# Patient Record
Sex: Male | Born: 1955 | Race: White | Hispanic: No | Smoking: Never smoker
Health system: Southern US, Community
[De-identification: ages and names within clinical notes are randomized; demographics above are authoritative.]

## PROBLEM LIST (undated history)

## (undated) DIAGNOSIS — K5903 Drug induced constipation: Secondary | ICD-10-CM

## (undated) DIAGNOSIS — E039 Hypothyroidism, unspecified: Secondary | ICD-10-CM

## (undated) DIAGNOSIS — N400 Enlarged prostate without lower urinary tract symptoms: Secondary | ICD-10-CM

## (undated) DIAGNOSIS — M1611 Unilateral primary osteoarthritis, right hip: Secondary | ICD-10-CM

## (undated) DIAGNOSIS — I10 Essential (primary) hypertension: Secondary | ICD-10-CM

## (undated) DIAGNOSIS — M87052 Idiopathic aseptic necrosis of left femur: Secondary | ICD-10-CM

## (undated) HISTORY — PX: OTHER SURGICAL HISTORY: SHX169

## (undated) HISTORY — PX: COLONOSCOPY: SHX174

## (undated) HISTORY — PX: TONSILLECTOMY: SUR1361

---

## 2005-08-18 ENCOUNTER — Ambulatory Visit: Payer: Self-pay | Admitting: Internal Medicine

## 2005-08-25 ENCOUNTER — Ambulatory Visit: Payer: Self-pay | Admitting: Internal Medicine

## 2005-09-11 ENCOUNTER — Ambulatory Visit: Payer: Self-pay | Admitting: Internal Medicine

## 2005-09-29 ENCOUNTER — Ambulatory Visit: Payer: Self-pay | Admitting: Internal Medicine

## 2005-09-30 ENCOUNTER — Ambulatory Visit: Payer: Self-pay | Admitting: Internal Medicine

## 2006-07-29 ENCOUNTER — Ambulatory Visit: Payer: Self-pay | Admitting: Internal Medicine

## 2007-08-19 ENCOUNTER — Encounter: Admission: RE | Admit: 2007-08-19 | Discharge: 2007-08-19 | Payer: Self-pay | Admitting: Emergency Medicine

## 2014-09-06 ENCOUNTER — Other Ambulatory Visit: Payer: Self-pay | Admitting: Orthopedic Surgery

## 2014-09-08 ENCOUNTER — Encounter (HOSPITAL_COMMUNITY)
Admission: RE | Admit: 2014-09-08 | Discharge: 2014-09-08 | Disposition: A | Discharge status: Home / Self Care | Payer: 59 | Source: Ambulatory Visit | Attending: Orthopedic Surgery | Admitting: Orthopedic Surgery

## 2014-09-08 ENCOUNTER — Encounter (HOSPITAL_COMMUNITY): Payer: Self-pay

## 2014-09-08 DIAGNOSIS — Z01812 Encounter for preprocedural laboratory examination: Secondary | ICD-10-CM | POA: Diagnosis not present

## 2014-09-08 DIAGNOSIS — I10 Essential (primary) hypertension: Secondary | ICD-10-CM | POA: Insufficient documentation

## 2014-09-08 DIAGNOSIS — Z79899 Other long term (current) drug therapy: Secondary | ICD-10-CM | POA: Diagnosis not present

## 2014-09-08 DIAGNOSIS — M1990 Unspecified osteoarthritis, unspecified site: Secondary | ICD-10-CM | POA: Insufficient documentation

## 2014-09-08 DIAGNOSIS — E039 Hypothyroidism, unspecified: Secondary | ICD-10-CM | POA: Diagnosis not present

## 2014-09-08 DIAGNOSIS — Z7982 Long term (current) use of aspirin: Secondary | ICD-10-CM | POA: Diagnosis not present

## 2014-09-08 DIAGNOSIS — Z01818 Encounter for other preprocedural examination: Secondary | ICD-10-CM | POA: Insufficient documentation

## 2014-09-08 HISTORY — DX: Hypothyroidism, unspecified: E03.9

## 2014-09-08 HISTORY — DX: Essential (primary) hypertension: I10

## 2014-09-08 LAB — BASIC METABOLIC PANEL
ANION GAP: 13 (ref 5–15)
BUN: 28 mg/dL — ABNORMAL HIGH (ref 6–20)
CALCIUM: 10 mg/dL (ref 8.9–10.3)
CO2: 25 mmol/L (ref 22–32)
Chloride: 99 mmol/L — ABNORMAL LOW (ref 101–111)
Creatinine, Ser: 2.14 mg/dL — ABNORMAL HIGH (ref 0.61–1.24)
GFR, EST AFRICAN AMERICAN: 37 mL/min — AB (ref >60)
GFR, EST NON AFRICAN AMERICAN: 32 mL/min — AB (ref >60)
GLUCOSE: 93 mg/dL (ref 65–99)
Potassium: 4 mmol/L (ref 3.5–5.1)
SODIUM: 137 mmol/L (ref 135–145)

## 2014-09-08 LAB — CBC
HCT: 48.6 % (ref 39.0–52.0)
Hemoglobin: 16.7 g/dL (ref 13.0–17.0)
MCH: 30.4 pg (ref 26.0–34.0)
MCHC: 34.4 g/dL (ref 30.0–36.0)
MCV: 88.5 fL (ref 78.0–100.0)
PLATELETS: 363 10*3/uL (ref 150–400)
RBC: 5.49 MIL/uL (ref 4.22–5.81)
RDW: 13.1 % (ref 11.5–15.5)
WBC: 8.9 10*3/uL (ref 4.0–10.5)

## 2014-09-08 LAB — SURGICAL PCR SCREEN
MRSA, PCR: NEGATIVE
Staphylococcus aureus: POSITIVE — AB

## 2014-09-08 NOTE — Progress Notes (Signed)
EKG requested from [facility],[place]

## 2014-09-08 NOTE — Pre-Procedure Instructions (Signed)
    Phillip HeysJames Cook  09/08/2014     No Pharmacies Listed   Your procedure is scheduled on  09-12-2014   Tuesday    [facility] Ad[facility] at  8:45 A.M.   Call this number if you have problems the morning of surgery:  [phone]   Remember:  Do not eat food or drink liquids after midnight.   Take these medicines the morning of surgery with A SIP OF WATER Amlodipine(Norvasc),levothyroxine(Synthroid),Adderell,movantik,pain medication if needed,tizandine(Zanaflex)     Do not wear jewelry,  Do not wear lotions, powders, or perfumes.     Do not shave 48 hours prior to surgery.  Men may shave face and neck.   Do not bring valuables to the hospital.  [facility] is not responsible for any belongings or valuables.  Contacts, dentures or bridgework may not be worn into surgery.  Leave your suitcase in the car.  After surgery it may be brought to your room.  For patients admitted to the hospital, discharge time will be determined by your treatment team.  .    Special instructions:  See attached sheet for instructions on CHG shower/bath  Please read over the following fact sheets that you were given. Pain Booklet, Coughing and Deep Breathing and Surgical Site Infection Prevention

## 2014-09-08 NOTE — Progress Notes (Signed)
I called a prescription for Mupirocin ointment to [facility], [address], [place], [place].

## 2014-09-11 ENCOUNTER — Encounter (HOSPITAL_COMMUNITY): Payer: Self-pay | Admitting: Emergency Medicine

## 2014-09-11 MED ORDER — CEFAZOLIN SODIUM-DEXTROSE 2-3 GM-% IV SOLR: 2.0000 g | INTRAVENOUS | Status: DC

## 2014-09-11 NOTE — Progress Notes (Signed)
Anesthesia Chart Review:  Pt is 59 year old male scheduled for R total hip arthroplasty on 09/12/2014 with Dr. Dion SaucierLandau.   PCP is Dr. Glenis SmokerAlexis Benjamin at [facility] in [place].   PMH includes: HTN, hypothyroidism. Never smoker. BMI 24.  Medications include: amlodipine, adderall, ASA, levothyroxine, lisinipril-hctz, movantik, oxycodone, oxycontin, tizanidine, ambien.  Preoperative labs reviewed.  BUN 28, Cr 2.14.  Contacted PCP's office for recent renal lab results. On 08/28/14, BUN 19 and Cr 1.1.  EKG 08/25/2014: Sinus rhythm with PVCs. Possible LA abnormality. Anterolateral ST elevation - possible early repolarization.  -EKG tracing copy is poor quality but appears to have significant artifact. Will repeat DOS.   Spoke with pt by telephone to try and understand dramatic, acute change in renal function. Pt reports burning, frequency and urgency with urination for last couple of weeks, and reports feeling feverish/chills on some days.   Spoke with Dr. Krista BlueSinger about case. Pt will need to be evaluated by his pcp prior to surgery.  Will fax a copy of my note and lab results to Dr. Elly ModenaBenjamin's office. Notified Sherri in Dr. Shelba FlakeLandau's office.   Rica Mastngela Tameyah Koch, FNP-BC [facility]/Anesthesiology Phone: [phone] 09/11/2014 4:12 PM

## 2014-09-12 ENCOUNTER — Encounter (HOSPITAL_COMMUNITY): Admission: RE | Payer: Self-pay | Source: Ambulatory Visit

## 2014-09-12 ENCOUNTER — Inpatient Hospital Stay (HOSPITAL_COMMUNITY): Admission: RE | Admit: 2014-09-12 | Payer: 59 | Source: Ambulatory Visit | Admitting: Orthopedic Surgery

## 2014-09-12 SURGERY — ARTHROPLASTY, HIP, TOTAL,POSTERIOR APPROACH
Anesthesia: General | Site: Hip | Laterality: Right

## 2014-09-13 NOTE — Progress Notes (Signed)
Anesthesia Chart Review:  Pt is [age] year old male scheduled for R total hip arthroplasty on 09/19/2014 with Dr. Dion SaucierLandau.   Please see my note dated 09/11/2014.   Pt saw urologist Dr. Mena GoesEskridge 09/12/14 for evaluation of acute renal function changes. Cr normalized at 1.1, BUN 18. UA WNL. Dx with acute renal failure that has resolved, and BPH with obstructive symptoms. Dr. Mena GoesEskridge has cleared pt to proceed with surgery.   Rica Mastngela Matilyn Fehrman, FNP-BC [facility]Anesthesiology Phone: [phone] 09/13/2014 1:09 PM

## 2014-09-18 ENCOUNTER — Other Ambulatory Visit: Payer: Self-pay | Admitting: Orthopedic Surgery

## 2014-09-19 ENCOUNTER — Inpatient Hospital Stay (HOSPITAL_COMMUNITY)
Admission: RE | Admit: 2014-09-19 | Discharge: 2014-09-22 | DRG: 470 | Disposition: A | Discharge status: Skilled Nursing Facility | Payer: 59 | Source: Ambulatory Visit | Attending: Orthopedic Surgery | Admitting: Orthopedic Surgery

## 2014-09-19 ENCOUNTER — Inpatient Hospital Stay (HOSPITAL_COMMUNITY): Payer: 59 | Admitting: Emergency Medicine

## 2014-09-19 ENCOUNTER — Inpatient Hospital Stay (HOSPITAL_COMMUNITY): Payer: 59

## 2014-09-19 ENCOUNTER — Encounter (HOSPITAL_COMMUNITY)
Admission: RE | Disposition: A | Discharge status: Skilled Nursing Facility | Payer: Self-pay | Source: Ambulatory Visit | Attending: Orthopedic Surgery

## 2014-09-19 ENCOUNTER — Encounter (HOSPITAL_COMMUNITY): Payer: Self-pay | Admitting: *Deleted

## 2014-09-19 ENCOUNTER — Other Ambulatory Visit: Payer: Self-pay

## 2014-09-19 DIAGNOSIS — E039 Hypothyroidism, unspecified: Secondary | ICD-10-CM | POA: Diagnosis present

## 2014-09-19 DIAGNOSIS — I10 Essential (primary) hypertension: Secondary | ICD-10-CM | POA: Diagnosis present

## 2014-09-19 DIAGNOSIS — M87851 Other osteonecrosis, right femur: Secondary | ICD-10-CM | POA: Diagnosis present

## 2014-09-19 DIAGNOSIS — Z79899 Other long term (current) drug therapy: Secondary | ICD-10-CM

## 2014-09-19 DIAGNOSIS — F1010 Alcohol abuse, uncomplicated: Secondary | ICD-10-CM | POA: Diagnosis present

## 2014-09-19 DIAGNOSIS — M1611 Unilateral primary osteoarthritis, right hip: Principal | ICD-10-CM | POA: Diagnosis present

## 2014-09-19 DIAGNOSIS — M1610 Unilateral primary osteoarthritis, unspecified hip: Secondary | ICD-10-CM | POA: Diagnosis present

## 2014-09-19 DIAGNOSIS — N401 Enlarged prostate with lower urinary tract symptoms: Secondary | ICD-10-CM | POA: Diagnosis present

## 2014-09-19 DIAGNOSIS — K5909 Other constipation: Secondary | ICD-10-CM | POA: Diagnosis present

## 2014-09-19 DIAGNOSIS — F1011 Alcohol abuse, in remission: Secondary | ICD-10-CM | POA: Insufficient documentation

## 2014-09-19 DIAGNOSIS — Z515 Encounter for palliative care: Secondary | ICD-10-CM | POA: Diagnosis not present

## 2014-09-19 DIAGNOSIS — G894 Chronic pain syndrome: Secondary | ICD-10-CM | POA: Diagnosis present

## 2014-09-19 DIAGNOSIS — T402X5A Adverse effect of other opioids, initial encounter: Secondary | ICD-10-CM | POA: Diagnosis present

## 2014-09-19 DIAGNOSIS — G8918 Other acute postprocedural pain: Secondary | ICD-10-CM | POA: Diagnosis not present

## 2014-09-19 DIAGNOSIS — K5903 Drug induced constipation: Secondary | ICD-10-CM | POA: Insufficient documentation

## 2014-09-19 DIAGNOSIS — N138 Other obstructive and reflux uropathy: Secondary | ICD-10-CM | POA: Diagnosis present

## 2014-09-19 DIAGNOSIS — M25551 Pain in right hip: Secondary | ICD-10-CM | POA: Diagnosis present

## 2014-09-19 HISTORY — DX: Unilateral primary osteoarthritis, right hip: M16.11

## 2014-09-19 HISTORY — PX: TOTAL HIP ARTHROPLASTY: SHX124

## 2014-09-19 LAB — BASIC METABOLIC PANEL
Anion gap: 7 (ref 5–15)
BUN: 12 mg/dL (ref 6–20)
CO2: 28 mmol/L (ref 22–32)
CREATININE: 1.17 mg/dL (ref 0.61–1.24)
Calcium: 9.3 mg/dL (ref 8.9–10.3)
Chloride: 103 mmol/L (ref 101–111)
GFR calc non Af Amer: >60 mL/min (ref >60)
Glucose, Bld: 110 mg/dL — ABNORMAL HIGH (ref 65–99)
Potassium: 3.6 mmol/L (ref 3.5–5.1)
Sodium: 138 mmol/L (ref 135–145)

## 2014-09-19 LAB — CBC
HEMATOCRIT: 43.1 % (ref 39.0–52.0)
Hemoglobin: 14.6 g/dL (ref 13.0–17.0)
MCH: 29.5 pg (ref 26.0–34.0)
MCHC: 33.9 g/dL (ref 30.0–36.0)
MCV: 87.1 fL (ref 78.0–100.0)
Platelets: 261 10*3/uL (ref 150–400)
RBC: 4.95 MIL/uL (ref 4.22–5.81)
RDW: 13.1 % (ref 11.5–15.5)
WBC: 8.2 10*3/uL (ref 4.0–10.5)

## 2014-09-19 SURGERY — ARTHROPLASTY, HIP, TOTAL,POSTERIOR APPROACH
Anesthesia: Spinal | Site: Hip | Laterality: Right

## 2014-09-19 MED ORDER — DEXAMETHASONE SODIUM PHOSPHATE 10 MG/ML IJ SOLN
10.0000 mg | Freq: Once | INTRAMUSCULAR | Status: AC
  Administered 2014-09-20: 10 mg via INTRAVENOUS
  Filled 2014-09-19: qty 1

## 2014-09-19 MED ORDER — EPHEDRINE SULFATE 50 MG/ML IJ SOLN
INTRAMUSCULAR | Status: DC | PRN
  Administered 2014-09-19 (×2): 10 mg via INTRAVENOUS

## 2014-09-19 MED ORDER — LACTATED RINGERS IV SOLN
INTRAVENOUS | Status: DC | PRN
  Administered 2014-09-19 (×2): via INTRAVENOUS

## 2014-09-19 MED ORDER — TIZANIDINE HCL 4 MG PO TABS
4.0000 mg | ORAL_TABLET | Freq: Three times a day (TID) | ORAL | Status: DC | PRN
  Administered 2014-09-19 – 2014-09-20 (×3): 4 mg via ORAL
  Filled 2014-09-19 (×3): qty 1

## 2014-09-19 MED ORDER — METHOCARBAMOL 500 MG PO TABS
ORAL_TABLET | ORAL | Status: AC
  Filled 2014-09-19: qty 1

## 2014-09-19 MED ORDER — 0.9 % SODIUM CHLORIDE (POUR BTL) OPTIME
TOPICAL | Status: DC | PRN
  Administered 2014-09-19: 1000 mL

## 2014-09-19 MED ORDER — ZOLPIDEM TARTRATE 5 MG PO TABS
10.0000 mg | ORAL_TABLET | Freq: Every day | ORAL | Status: DC
  Administered 2014-09-19 – 2014-09-21 (×3): 10 mg via ORAL
  Filled 2014-09-19 (×3): qty 2

## 2014-09-19 MED ORDER — HYDROMORPHONE HCL 1 MG/ML IJ SOLN
2.0000 mg | Freq: Once | INTRAMUSCULAR | Status: AC
  Administered 2014-09-19: 2 mg via INTRAVENOUS
  Filled 2014-09-19: qty 2

## 2014-09-19 MED ORDER — ONDANSETRON HCL 4 MG/2ML IJ SOLN: 4.0000 mg | Freq: Four times a day (QID) | INTRAMUSCULAR | Status: DC | PRN

## 2014-09-19 MED ORDER — HYDROMORPHONE HCL 1 MG/ML IJ SOLN
INTRAMUSCULAR | Status: AC
  Filled 2014-09-19: qty 1

## 2014-09-19 MED ORDER — NALOXEGOL OXALATE 25 MG PO TABS
25.0000 mg | ORAL_TABLET | Freq: Every day | ORAL | Status: DC
  Administered 2014-09-22: 25 mg via ORAL
  Filled 2014-09-19 (×3): qty 1

## 2014-09-19 MED ORDER — LIDOCAINE HCL (CARDIAC) 20 MG/ML IV SOLN
INTRAVENOUS | Status: DC | PRN
  Administered 2014-09-19: 60 mg via INTRAVENOUS

## 2014-09-19 MED ORDER — AMLODIPINE BESYLATE 10 MG PO TABS
10.0000 mg | ORAL_TABLET | Freq: Every day | ORAL | Status: DC
  Administered 2014-09-20 – 2014-09-22 (×3): 10 mg via ORAL
  Filled 2014-09-19 (×3): qty 1

## 2014-09-19 MED ORDER — LIDOCAINE HCL (CARDIAC) 20 MG/ML IV SOLN
INTRAVENOUS | Status: AC
  Filled 2014-09-19: qty 5

## 2014-09-19 MED ORDER — PHENYLEPHRINE 40 MCG/ML (10ML) SYRINGE FOR IV PUSH (FOR BLOOD PRESSURE SUPPORT)
PREFILLED_SYRINGE | INTRAVENOUS | Status: AC
  Filled 2014-09-19: qty 10

## 2014-09-19 MED ORDER — TIZANIDINE HCL 4 MG PO TABS: 4.0000 mg | ORAL_TABLET | Freq: Three times a day (TID) | ORAL | Status: DC | PRN

## 2014-09-19 MED ORDER — HYDROMORPHONE HCL 2 MG PO TABS: 2.0000 mg | ORAL_TABLET | ORAL | Status: DC | PRN

## 2014-09-19 MED ORDER — HYDROMORPHONE HCL 1 MG/ML IJ SOLN
1.5000 mg | INTRAMUSCULAR | Status: DC | PRN
  Administered 2014-09-19: 1.5 mg via INTRAVENOUS
  Filled 2014-09-19: qty 2

## 2014-09-19 MED ORDER — FENTANYL CITRATE (PF) 100 MCG/2ML IJ SOLN
INTRAMUSCULAR | Status: DC | PRN
  Administered 2014-09-19 (×3): 50 ug via INTRAVENOUS

## 2014-09-19 MED ORDER — METHOCARBAMOL 500 MG PO TABS
500.0000 mg | ORAL_TABLET | Freq: Four times a day (QID) | ORAL | Status: DC | PRN
  Administered 2014-09-19: 500 mg via ORAL
  Filled 2014-09-19: qty 1

## 2014-09-19 MED ORDER — LISINOPRIL 20 MG PO TABS
20.0000 mg | ORAL_TABLET | Freq: Every day | ORAL | Status: DC
  Administered 2014-09-20 – 2014-09-22 (×3): 20 mg via ORAL
  Filled 2014-09-19 (×3): qty 1

## 2014-09-19 MED ORDER — METHOCARBAMOL 1000 MG/10ML IJ SOLN
500.0000 mg | Freq: Four times a day (QID) | INTRAVENOUS | Status: DC | PRN
  Filled 2014-09-19: qty 5

## 2014-09-19 MED ORDER — PROPOFOL 10 MG/ML IV BOLUS
INTRAVENOUS | Status: DC | PRN
  Administered 2014-09-19: 75 mg via INTRAVENOUS
  Administered 2014-09-19: 25 mg via INTRAVENOUS

## 2014-09-19 MED ORDER — ONDANSETRON HCL 4 MG PO TABS: 4.0000 mg | ORAL_TABLET | Freq: Four times a day (QID) | ORAL | Status: DC | PRN

## 2014-09-19 MED ORDER — DOCUSATE SODIUM 100 MG PO CAPS
100.0000 mg | ORAL_CAPSULE | Freq: Two times a day (BID) | ORAL | Status: DC
  Administered 2014-09-19: 100 mg via ORAL
  Filled 2014-09-19: qty 1

## 2014-09-19 MED ORDER — ALUM & MAG HYDROXIDE-SIMETH 200-200-20 MG/5ML PO SUSP: 30.0000 mL | ORAL | Status: DC | PRN

## 2014-09-19 MED ORDER — PHENOL 1.4 % MT LIQD: 1.0000 | OROMUCOSAL | Status: DC | PRN

## 2014-09-19 MED ORDER — RIVAROXABAN 10 MG PO TABS
10.0000 mg | ORAL_TABLET | Freq: Every day | ORAL | Status: DC
  Administered 2014-09-20 – 2014-09-22 (×3): 10 mg via ORAL
  Filled 2014-09-19 (×3): qty 1

## 2014-09-19 MED ORDER — TESTOSTERONE 20.25 MG/ACT (1.62%) TD GEL
20.2500 mg | Freq: Every day | TRANSDERMAL | Status: DC
  Filled 2014-09-19: qty 1

## 2014-09-19 MED ORDER — SUCCINYLCHOLINE CHLORIDE 20 MG/ML IJ SOLN
INTRAMUSCULAR | Status: AC
  Filled 2014-09-19: qty 1

## 2014-09-19 MED ORDER — ACETAMINOPHEN 325 MG PO TABS: 650.0000 mg | ORAL_TABLET | Freq: Four times a day (QID) | ORAL | Status: DC | PRN

## 2014-09-19 MED ORDER — SENNA-DOCUSATE SODIUM 8.6-50 MG PO TABS: 2.0000 | ORAL_TABLET | Freq: Every day | ORAL | Status: DC

## 2014-09-19 MED ORDER — MAGNESIUM CITRATE PO SOLN: 1.0000 | Freq: Once | ORAL | Status: AC | PRN

## 2014-09-19 MED ORDER — ACETAMINOPHEN 650 MG RE SUPP: 650.0000 mg | Freq: Four times a day (QID) | RECTAL | Status: DC | PRN

## 2014-09-19 MED ORDER — CEFAZOLIN SODIUM-DEXTROSE 2-3 GM-% IV SOLR
2.0000 g | Freq: Four times a day (QID) | INTRAVENOUS | Status: AC
  Administered 2014-09-19 (×2): 2 g via INTRAVENOUS
  Filled 2014-09-19 (×3): qty 50

## 2014-09-19 MED ORDER — MEPERIDINE HCL 25 MG/ML IJ SOLN: 6.2500 mg | INTRAMUSCULAR | Status: DC | PRN

## 2014-09-19 MED ORDER — MENTHOL 3 MG MT LOZG: 1.0000 | LOZENGE | OROMUCOSAL | Status: DC | PRN

## 2014-09-19 MED ORDER — MIDAZOLAM HCL 2 MG/2ML IJ SOLN
INTRAMUSCULAR | Status: AC
  Filled 2014-09-19: qty 2

## 2014-09-19 MED ORDER — BACLOFEN 10 MG PO TABS: 10.0000 mg | ORAL_TABLET | Freq: Three times a day (TID) | ORAL | Status: DC

## 2014-09-19 MED ORDER — SENNA 8.6 MG PO TABS
1.0000 | ORAL_TABLET | Freq: Two times a day (BID) | ORAL | Status: DC
  Administered 2014-09-19: 8.6 mg via ORAL
  Filled 2014-09-19: qty 1

## 2014-09-19 MED ORDER — CEFAZOLIN SODIUM-DEXTROSE 2-3 GM-% IV SOLR
2.0000 g | INTRAVENOUS | Status: AC
  Administered 2014-09-19: 2 g via INTRAVENOUS
  Filled 2014-09-19: qty 50

## 2014-09-19 MED ORDER — ONDANSETRON HCL 4 MG PO TABS: 4.0000 mg | ORAL_TABLET | Freq: Three times a day (TID) | ORAL | Status: DC | PRN

## 2014-09-19 MED ORDER — PROMETHAZINE HCL 25 MG/ML IJ SOLN: 6.2500 mg | INTRAMUSCULAR | Status: DC | PRN

## 2014-09-19 MED ORDER — NALOXONE HCL 0.4 MG/ML IJ SOLN: 0.4000 mg | INTRAMUSCULAR | Status: DC | PRN

## 2014-09-19 MED ORDER — HYDROMORPHONE HCL 1 MG/ML IJ SOLN
1.0000 mg | INTRAMUSCULAR | Status: DC | PRN
  Administered 2014-09-19: 2 mg via INTRAVENOUS
  Administered 2014-09-19 (×2): 1 mg via INTRAVENOUS
  Filled 2014-09-19 (×2): qty 1

## 2014-09-19 MED ORDER — DIPHENHYDRAMINE HCL 12.5 MG/5ML PO ELIX
12.5000 mg | ORAL_SOLUTION | ORAL | Status: DC | PRN
Start: 2014-09-19 — End: 2014-09-22

## 2014-09-19 MED ORDER — ONDANSETRON HCL 4 MG/2ML IJ SOLN
INTRAMUSCULAR | Status: AC
  Filled 2014-09-19: qty 2

## 2014-09-19 MED ORDER — OXYCODONE HCL 5 MG PO TABS
15.0000 mg | ORAL_TABLET | Freq: Every day | ORAL | Status: DC
  Administered 2014-09-19 (×2): 15 mg via ORAL
  Filled 2014-09-19 (×2): qty 3

## 2014-09-19 MED ORDER — PROPOFOL INFUSION 10 MG/ML OPTIME
INTRAVENOUS | Status: DC | PRN
  Administered 2014-09-19: 80 ug/kg/min via INTRAVENOUS

## 2014-09-19 MED ORDER — DIAZEPAM 5 MG PO TABS: 5.0000 mg | ORAL_TABLET | Freq: Four times a day (QID) | ORAL | Status: DC | PRN

## 2014-09-19 MED ORDER — HYDROCHLOROTHIAZIDE 12.5 MG PO CAPS
12.5000 mg | ORAL_CAPSULE | Freq: Every day | ORAL | Status: DC
  Administered 2014-09-20 – 2014-09-22 (×3): 12.5 mg via ORAL
  Filled 2014-09-19 (×3): qty 1

## 2014-09-19 MED ORDER — MIDAZOLAM HCL 5 MG/5ML IJ SOLN
INTRAMUSCULAR | Status: DC | PRN
  Administered 2014-09-19: 2 mg via INTRAVENOUS

## 2014-09-19 MED ORDER — FENTANYL CITRATE (PF) 250 MCG/5ML IJ SOLN
INTRAMUSCULAR | Status: AC
  Filled 2014-09-19: qty 5

## 2014-09-19 MED ORDER — BISACODYL 10 MG RE SUPP: 10.0000 mg | Freq: Every day | RECTAL | Status: DC | PRN

## 2014-09-19 MED ORDER — ZOLPIDEM TARTRATE 5 MG PO TABS: 5.0000 mg | ORAL_TABLET | Freq: Every evening | ORAL | Status: DC | PRN

## 2014-09-19 MED ORDER — LISINOPRIL-HYDROCHLOROTHIAZIDE 20-12.5 MG PO TABS: 1.0000 | ORAL_TABLET | Freq: Every day | ORAL | Status: DC

## 2014-09-19 MED ORDER — EPHEDRINE SULFATE 50 MG/ML IJ SOLN
INTRAMUSCULAR | Status: AC
  Filled 2014-09-19: qty 1

## 2014-09-19 MED ORDER — SENNOSIDES-DOCUSATE SODIUM 8.6-50 MG PO TABS
2.0000 | ORAL_TABLET | Freq: Two times a day (BID) | ORAL | Status: DC
  Administered 2014-09-19 – 2014-09-22 (×6): 2 via ORAL
  Filled 2014-09-19 (×6): qty 2

## 2014-09-19 MED ORDER — METOCLOPRAMIDE HCL 5 MG/ML IJ SOLN
5.0000 mg | Freq: Three times a day (TID) | INTRAMUSCULAR | Status: DC | PRN
Start: 2014-09-19 — End: 2014-09-22

## 2014-09-19 MED ORDER — OXYCODONE HCL ER 15 MG PO T12A
30.0000 mg | EXTENDED_RELEASE_TABLET | Freq: Two times a day (BID) | ORAL | Status: DC
  Administered 2014-09-19: 30 mg via ORAL
  Filled 2014-09-19: qty 2

## 2014-09-19 MED ORDER — OXYCODONE HCL 15 MG PO TABS: 15.0000 mg | ORAL_TABLET | Freq: Every day | ORAL | Status: DC

## 2014-09-19 MED ORDER — OXYCODONE HCL 5 MG PO TABS
5.0000 mg | ORAL_TABLET | ORAL | Status: DC | PRN
  Administered 2014-09-19: 5 mg via ORAL

## 2014-09-19 MED ORDER — PROPOFOL 10 MG/ML IV BOLUS
INTRAVENOUS | Status: AC
  Filled 2014-09-19: qty 20

## 2014-09-19 MED ORDER — HYDROMORPHONE HCL 1 MG/ML IJ SOLN: 1.5000 mg | INTRAMUSCULAR | Status: DC | PRN

## 2014-09-19 MED ORDER — SODIUM CHLORIDE 0.9 % IJ SOLN
INTRAMUSCULAR | Status: AC
  Filled 2014-09-19: qty 10

## 2014-09-19 MED ORDER — OXYCONTIN 30 MG PO T12A: 30.0000 mg | EXTENDED_RELEASE_TABLET | Freq: Two times a day (BID) | ORAL | Status: DC

## 2014-09-19 MED ORDER — DIAZEPAM 5 MG PO TABS
5.0000 mg | ORAL_TABLET | Freq: Four times a day (QID) | ORAL | Status: DC | PRN
  Administered 2014-09-19 – 2014-09-20 (×3): 5 mg via ORAL
  Filled 2014-09-19 (×3): qty 1

## 2014-09-19 MED ORDER — POTASSIUM CHLORIDE IN NACL 20-0.45 MEQ/L-% IV SOLN
INTRAVENOUS | Status: DC
  Administered 2014-09-19 – 2014-09-20 (×2): via INTRAVENOUS
  Filled 2014-09-19 (×8): qty 1000

## 2014-09-19 MED ORDER — LEVOTHYROXINE SODIUM 150 MCG PO TABS
150.0000 ug | ORAL_TABLET | Freq: Two times a day (BID) | ORAL | Status: DC
  Administered 2014-09-19 – 2014-09-22 (×6): 150 ug via ORAL
  Filled 2014-09-19 (×7): qty 1

## 2014-09-19 MED ORDER — PHENYLEPHRINE HCL 10 MG/ML IJ SOLN
10.0000 mg | INTRAVENOUS | Status: DC | PRN
  Administered 2014-09-19: 10 ug/min via INTRAVENOUS

## 2014-09-19 MED ORDER — AMPHETAMINE-DEXTROAMPHETAMINE 10 MG PO TABS
20.0000 mg | ORAL_TABLET | Freq: Every day | ORAL | Status: DC
  Administered 2014-09-20 – 2014-09-22 (×3): 20 mg via ORAL
  Filled 2014-09-19 (×3): qty 2

## 2014-09-19 MED ORDER — ROCURONIUM BROMIDE 50 MG/5ML IV SOLN
INTRAVENOUS | Status: AC
  Filled 2014-09-19: qty 1

## 2014-09-19 MED ORDER — HYDROMORPHONE HCL 1 MG/ML IJ SOLN
0.2500 mg | INTRAMUSCULAR | Status: DC | PRN
  Administered 2014-09-19 (×4): 0.5 mg via INTRAVENOUS

## 2014-09-19 MED ORDER — POLYETHYLENE GLYCOL 3350 17 G PO PACK: 17.0000 g | PACK | Freq: Every day | ORAL | Status: DC | PRN

## 2014-09-19 MED ORDER — OXYCODONE HCL ER 30 MG PO T12A: 30.0000 mg | EXTENDED_RELEASE_TABLET | Freq: Two times a day (BID) | ORAL | Status: DC

## 2014-09-19 MED ORDER — OXYCODONE HCL 5 MG PO TABS
ORAL_TABLET | ORAL | Status: AC
  Filled 2014-09-19: qty 1

## 2014-09-19 MED ORDER — HYDROMORPHONE 0.3 MG/ML IV SOLN
INTRAVENOUS | Status: DC
  Administered 2014-09-19 – 2014-09-20 (×2): via INTRAVENOUS
  Administered 2014-09-20: 2.25 mg via INTRAVENOUS
  Administered 2014-09-20: 7.54 mg via INTRAVENOUS
  Administered 2014-09-20 (×2): via INTRAVENOUS
  Administered 2014-09-20: 10.4 mg via INTRAVENOUS
  Administered 2014-09-20: 4.55 mg via INTRAVENOUS
  Administered 2014-09-20: 4.88 mg via INTRAVENOUS
  Administered 2014-09-21: 5.08 mg via INTRAVENOUS
  Administered 2014-09-21: 03:00:00 via INTRAVENOUS
  Administered 2014-09-21: 6.6 mg via INTRAVENOUS
  Administered 2014-09-21: 09:00:00 via INTRAVENOUS
  Administered 2014-09-21: 2.15 mg via INTRAVENOUS
  Filled 2014-09-19 (×7): qty 25

## 2014-09-19 MED ORDER — SODIUM CHLORIDE 0.9 % IJ SOLN: 9.0000 mL | INTRAMUSCULAR | Status: DC | PRN

## 2014-09-19 MED ORDER — METOCLOPRAMIDE HCL 5 MG PO TABS: 5.0000 mg | ORAL_TABLET | Freq: Three times a day (TID) | ORAL | Status: DC | PRN

## 2014-09-19 MED ORDER — RIVAROXABAN 10 MG PO TABS: 10.0000 mg | ORAL_TABLET | Freq: Every day | ORAL | Status: DC

## 2014-09-19 SURGICAL SUPPLY — 64 items
BLADE SAW SAG 73X25 THK (BLADE) ×2
BLADE SAW SGTL 73X25 THK (BLADE) ×1 IMPLANT
BRUSH FEMORAL CANAL (MISCELLANEOUS) IMPLANT
CAPT HIP TOTAL 2 ×3 IMPLANT
CLOSURE STERI-STRIP 1/2X4 (GAUZE/BANDAGES/DRESSINGS) ×2
CLSR STERI-STRIP ANTIMIC 1/2X4 (GAUZE/BANDAGES/DRESSINGS) ×4 IMPLANT
COVER SURGICAL LIGHT HANDLE (MISCELLANEOUS) ×3 IMPLANT
DRAPE IMP U-DRAPE 54X76 (DRAPES) ×3 IMPLANT
DRAPE INCISE IOBAN 66X45 STRL (DRAPES) IMPLANT
DRAPE ORTHO SPLIT 77X108 STRL (DRAPES) ×4
DRAPE PROXIMA HALF (DRAPES) ×6 IMPLANT
DRAPE SURG ORHT 6 SPLT 77X108 (DRAPES) ×2 IMPLANT
DRAPE U-SHAPE 47X51 STRL (DRAPES) ×3 IMPLANT
DRILL BIT 5/64 (BIT) ×3 IMPLANT
DRSG MEPILEX BORDER 4X12 (GAUZE/BANDAGES/DRESSINGS) IMPLANT
DRSG MEPILEX BORDER 4X8 (GAUZE/BANDAGES/DRESSINGS) ×3 IMPLANT
DRSG PAD ABDOMINAL 8X10 ST (GAUZE/BANDAGES/DRESSINGS) IMPLANT
DURAPREP 26ML APPLICATOR (WOUND CARE) ×3 IMPLANT
ELECT CAUTERY BLADE 6.4 (BLADE) ×3 IMPLANT
ELECT REM PT RETURN 9FT ADLT (ELECTROSURGICAL) ×3
ELECTRODE REM PT RTRN 9FT ADLT (ELECTROSURGICAL) ×1 IMPLANT
FACESHIELD STD STERILE (MASK) ×3 IMPLANT
GLOVE BIOGEL PI IND STRL 8 (GLOVE) ×1 IMPLANT
GLOVE BIOGEL PI INDICATOR 8 (GLOVE) ×2
GLOVE BIOGEL PI ORTHO PRO SZ8 (GLOVE) ×2
GLOVE ORTHO TXT STRL SZ7.5 (GLOVE) ×3 IMPLANT
GLOVE PI ORTHO PRO STRL SZ8 (GLOVE) ×1 IMPLANT
GLOVE SURG ORTHO 8.0 STRL STRW (GLOVE) ×3 IMPLANT
GOWN STRL REUS W/ TWL LRG LVL3 (GOWN DISPOSABLE) ×2 IMPLANT
GOWN STRL REUS W/ TWL XL LVL3 (GOWN DISPOSABLE) ×1 IMPLANT
GOWN STRL REUS W/TWL 2XL LVL3 (GOWN DISPOSABLE) ×3 IMPLANT
GOWN STRL REUS W/TWL LRG LVL3 (GOWN DISPOSABLE) ×4
GOWN STRL REUS W/TWL XL LVL3 (GOWN DISPOSABLE) ×2
HANDPIECE INTERPULSE COAX TIP (DISPOSABLE)
HOOD PEEL AWAY FACE SHEILD DIS (HOOD) ×3 IMPLANT
KIT BASIN OR (CUSTOM PROCEDURE TRAY) ×3 IMPLANT
KIT ROOM TURNOVER OR (KITS) ×3 IMPLANT
MANIFOLD NEPTUNE II (INSTRUMENTS) ×3 IMPLANT
NDL SUT .5 MAYO 1.404X.05X (NEEDLE) ×1 IMPLANT
NEEDLE HYPO 25GX1X1/2 BEV (NEEDLE) ×3 IMPLANT
NEEDLE MAYO TAPER (NEEDLE) ×2
NS IRRIG 1000ML POUR BTL (IV SOLUTION) ×3 IMPLANT
PACK TOTAL JOINT (CUSTOM PROCEDURE TRAY) ×3 IMPLANT
PAD ARMBOARD 7.5X6 YLW CONV (MISCELLANEOUS) ×6 IMPLANT
PILLOW ABDUCTION HIP (SOFTGOODS) ×3 IMPLANT
PRESSURIZER FEMORAL UNIV (MISCELLANEOUS) IMPLANT
RETRIEVER SUT HEWSON (MISCELLANEOUS) ×3 IMPLANT
SET HNDPC FAN SPRY TIP SCT (DISPOSABLE) IMPLANT
SPONGE LAP 4X18 X RAY DECT (DISPOSABLE) IMPLANT
SUCTION FRAZIER TIP 10 FR DISP (SUCTIONS) ×3 IMPLANT
SUT FIBERWIRE #2 38 REV NDL BL (SUTURE) ×9
SUT MNCRL AB 4-0 PS2 18 (SUTURE) IMPLANT
SUT VIC AB 0 CT1 27 (SUTURE) ×3
SUT VIC AB 0 CT1 27XBRD ANBCTR (SUTURE) ×1 IMPLANT
SUT VIC AB 2-0 CT1 27 (SUTURE) ×2
SUT VIC AB 2-0 CT1 TAPERPNT 27 (SUTURE) ×1 IMPLANT
SUT VIC AB 3-0 SH 8-18 (SUTURE) ×3 IMPLANT
SUTURE FIBERWR#2 38 REV NDL BL (SUTURE) ×3 IMPLANT
SYR CONTROL 10ML LL (SYRINGE) ×3 IMPLANT
TOWEL OR 17X24 6PK STRL BLUE (TOWEL DISPOSABLE) ×3 IMPLANT
TOWEL OR 17X26 10 PK STRL BLUE (TOWEL DISPOSABLE) ×3 IMPLANT
TOWER CARTRIDGE SMART MIX (DISPOSABLE) IMPLANT
TRAY FOLEY CATH 14FR (SET/KITS/TRAYS/PACK) IMPLANT
WATER STERILE IRR 1000ML POUR (IV SOLUTION) ×6 IMPLANT

## 2014-09-19 NOTE — Consult Note (Signed)
Consultation Note Date: 09/19/2014   Patient Name: Phillip Cook  DOB: 06-Jul-1955  MRN: [id number]  Age / Sex: 59 y.o., male   PCP: Pecola Lawless, MD Referring Physician: Teryl Lucy, MD  Reason for Consultation: Pain control  Palliative Care Assessment and Plan Summary of Established Goals of Care and Medical Treatment Preferences   Clinical Assessment/Narrative: 59 yo male with DJD s/p arthroplasty complicated by post-op pain, chronic pain, substance abuse  Symptom Management:   Acute Post-Op Pain on Chronic Pain- This will be extremely challenging case. He has been non-compliant with pain clinic and readily admits so.  He uses benzo's, muscle relaxers, adderal at home (combination of CNS depressants and stimulants) and also reports fairly heavy etoh use.  On top of this he now has acute pain that has not responded well to some high dose PRN's. I have reviewed [place] and he last was on oxycontin 30mg  BID (ran out early written 5/23) and oxycodone 15mg  (written 6/13). On 6/8 he did have 30mg  oxycodone tabs prescribed and I would assume that they reduced dose because he again took more than prescribed allotment. I think we will have to increase his pain medicine but how much is difficult to determine because of above.  I think the best way to handle this situation is d/c all current opioids and start a PCA with basal and bolus dosing. This will allow [place] most control and easiest ability to titrate. I will leave an IV backup dose of dilaudid overnight in case my changes do not work.  From talking to him he takes somewhere between 240-300mg  of oxycodone per day in long acting and break through but I have no idea if this is accurate as he runs out of pain meds early.  I will do Hydromorphone PCA with basal of 0.4mg /hr (will be replacement for oxycontin) and 0.6mg  bolus dose q59min PRN.    I will d/c robaxin as I think this will only contribute to sedation. I think we should also try to wean his  tizanidine as he stabilizes. Long term I do not think he is a good candidate for chronic opioid therapy given this history and will need significant help out of hospital.    Opioid Induced Constipation- problem before this procedure. Add schedule senna as stim laxative for opioids.  Substance Abuse- he heavily uses etoh. He needs to abstain with all these medication. Leave PRN benzo as I would have slight concern for etoh withdrawal in him. He reports last drink 3 days ago. Etoh withdrawal can occur in window of 3-5 days post cessation.           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  His pain has been worsening over months and he has been followed by Roselind Messier from preferred pain management here in [place].  He underwent successful arthroplasty but has significant post operative pain. He states that his pain is currently 9/10 and not responded well to pain medications including 2mg  bolus of IV dialudid.  He tells me that at home pain management has been very difficult.  He is displeased with pain clinic because he feels they do not give him enough pain medicine and readily admits he takes more than prescribed and runs out early.  He was last on OxyContin  BID and oxycodone  up to 6x/day as PRN.  He actually had a  prescription for PRN oxycodone but ran out of that too fast as well and it appears that he was reduced back to oxycodone  as PRN breakthrough dose.  He reports running out of 30 day supply of oxycontin last week despite this being written for on 5/23. He also takes xanaflex and zolpidem at home. Reports taking adderal for years for "concentration". He reports that he does have issues with constipation and these pain medicines as well.  He  drinks heavily at home. Reports ~4drinks per day but can't tell me size and says that it may be beer/wine/whiskey.  Right now pain is still severe. He denies pruritis N/V, abd pain, SOB.  No other acute complaints outside of post op pain.   Primary Diagnoses  Present on Admission:  . Primary localized osteoarthritis of right hip . Hip arthritis   I have reviewed the medical record, interviewed the patient and family, and examined the patient. The following aspects are pertinent.  Past Medical History  Diagnosis Date  . Hypertension   . Hypothyroidism   . Arthritis   . Primary localized osteoarthritis of right hip 09/19/2014   History   Social History  . Marital Status: Single    Spouse Name: N/A  . Number of Children: N/A  . Years of Education: N/A   Social History Main Topics  . Smoking status: Never Smoker   . Smokeless tobacco: Not on file  . Alcohol Use: 1.2 oz/week    1 Cans of beer, 1 Glasses of wine per week  . Drug Use: Yes    Special: Marijuana     Comment: many years ago  . Sexual Activity: Not on file   Other Topics Concern  . None   Social History Narrative  ADDENDUM TO SOCIAL HX: DRINKS ~4 drinks per day between beer/wine/whiskey.  Last drink 3 days ago per his report History reviewed. No pertinent family history. Scheduled Meds: . [START ON 09/20/2014] amLODipine  10 mg Oral Daily  . [START ON 09/20/2014] amphetamine-dextroamphetamine  20 mg Oral Daily  .  ceFAZolin (ANCEF) IV  2 g Intravenous Q6H  . [START ON 09/20/2014] dexamethasone  10 mg Intravenous Once  . docusate sodium  100 mg Oral BID  . [START ON 09/20/2014] lisinopril  20 mg Oral Daily   And  . [START ON 09/20/2014] hydrochlorothiazide  12.5 mg Oral Daily  . HYDROmorphone      . HYDROmorphone      . HYDROmorphone PCA 0.3 mg/mL   Intravenous 6 times per day  . levothyroxine  150 mcg Oral BID  . methocarbamol      . [START ON 09/20/2014] naloxegol oxalate  25 mg Oral Daily  . [START ON  09/20/2014] rivaroxaban  10 mg Oral Q breakfast  . senna  1 tablet Oral BID  . Testosterone  20.25 mg Transdermal Daily  . zolpidem  10 mg Oral QHS   Continuous Infusions: . 0.45 % NaCl with KCl 20 mEq / L 75 mL/hr at 09/19/14 1416   PRN Meds:.acetaminophen **OR** acetaminophen, alum & mag hydroxide-simeth, bisacodyl, diazepam, diphenhydrAMINE, HYDROmorphone (DILAUDID) injection, magnesium citrate, menthol-cetylpyridinium **OR** phenol, metoCLOPramide **OR** metoCLOPramide (REGLAN) injection, naloxone **AND** sodium chloride, ondansetron **OR** ondansetron (ZOFRAN) IV, polyethylene glycol, tiZANidine Medications Prior to Admission:  Prior to Admission medications   Medication Sig Start Date End Date Taking? Authorizing Provider  amLODipine (NORVASC) 5  MG tablet Take 10 mg by mouth daily. 08/24/14  Yes Historical Provider, MD  amphetamine-dextroamphetamine (ADDERALL) 20 MG tablet Take 20 mg by mouth daily. 08/28/14  Yes Historical Provider, MD  ANDROGEL PUMP 20.25 MG/ACT (1.62%) GEL Apply 4 pumps onto skin daily 08/28/14  Yes Historical Provider, MD  aspirin 325 MG tablet Take 325 mg by mouth daily.   Yes Historical Provider, MD  levothyroxine (SYNTHROID, LEVOTHROID) 75 MCG tablet Take 150 mcg by mouth 2 (two) times daily. 08/14/14  Yes Historical Provider, MD  lisinopril-hydrochlorothiazide (PRINZIDE,ZESTORETIC) 20-12.5 MG per tablet Take 1 tablet by mouth daily. 08/14/14  Yes Historical Provider, MD  MOVANTIK 25 MG TABS tablet Take 25 mg by mouth every morning. 08/18/14  Yes Historical Provider, MD  OVER THE COUNTER MEDICATION Take 1 tablet by mouth as needed (constipation). OTC laxative   Yes Historical Provider, MD  tiZANidine (ZANAFLEX) 4 MG tablet Take 4 mg by mouth every 8 (eight) hours as needed for muscle spasms.  08/24/14  Yes Historical Provider, MD  zolpidem (AMBIEN) 10 MG tablet Take 10 mg by mouth at bedtime. 08/28/14  Yes Historical Provider, MD  baclofen (LIORESAL) 10 MG tablet Take 1  tablet (10 mg total) by mouth 3 (three) times daily. As needed for muscle spasm 09/19/14   Teryl Lucy, MD  diazepam (VALIUM) 5 MG tablet Take 1 tablet (5 mg total) by mouth every 6 (six) hours as needed for anxiety. 09/19/14   Teryl Lucy, MD  HYDROmorphone (DILAUDID) 2 MG tablet Take 1 tablet (2 mg total) by mouth every 4 (four) hours as needed for severe pain. 09/19/14   Teryl Lucy, MD  ondansetron (ZOFRAN) 4 MG tablet Take 1 tablet (4 mg total) by mouth every 8 (eight) hours as needed for nausea or vomiting. 09/19/14   Teryl Lucy, MD  oxyCODONE (ROXICODONE) 15 MG immediate release tablet Take 1-2 tablets (15-30 mg total) by mouth 6 (six) times daily. 09/19/14   Teryl Lucy, MD  OXYCONTIN 30 MG T12A Take 30 mg by mouth 2 (two) times daily. 09/19/14   Teryl Lucy, MD  rivaroxaban (XARELTO) 10 MG TABS tablet Take 1 tablet (10 mg total) by mouth daily. 09/19/14   Teryl Lucy, MD  sennosides-docusate sodium (SENOKOT-S) 8.6-50 MG tablet Take 2 tablets by mouth daily. 09/19/14   Teryl Lucy, MD   Allergies  Allergen Reactions  . Codeine Itching  . Other Other (See Comments)    Metal Nickel   Skin becomes red with itching and weeping     CBC:    Component Value Date/Time   WBC 8.2 09/19/2014 0703   HGB 14.6 09/19/2014 0703   HCT 43.1 09/19/2014 0703   PLT 261 09/19/2014 0703   MCV 87.1 09/19/2014 0703   Comprehensive Metabolic Panel:    Component Value Date/Time   NA 138 09/19/2014 0703   K 3.6 09/19/2014 0703   CL 103 09/19/2014 0703   CO2 28 09/19/2014 0703   BUN 12 09/19/2014 0703   CREATININE 1.17 09/19/2014 0703   GLUCOSE 110* 09/19/2014 0703   CALCIUM 9.3 09/19/2014 0703    Physical Exam: Vital Signs: BP 113/63 mmHg  Pulse 70  Temp(Src) 98 F (36.7 C) (Oral)  Resp 16  Ht 6\' 2"  (1.88 m)  Wt 83.462 kg (184 lb)  BMI 23.61 kg/m2  SpO2 96% SpO2: SpO2: 96 % O2 Device: O2 Device: Nasal Cannula O2 Flow Rate: O2 Flow Rate (L/min): 2 L/min Intake/output summary:    Intake/Output Summary (Last 24 hours)  at 09/19/14 1733 Last data filed at 09/19/14 1259  Gross per 24 hour  Intake   1810 ml  Output    350 ml  Net   1460 ml   LBM: Last BM Date: 09/18/14 Baseline Weight: Weight: 83.462 kg (184 lb) Most recent weight: Weight: 83.462 kg (184 lb)  Exam Findings:  GEN: alert, diaphoretic HEENT: , sclera anicteric CV: RRR ABD: soft, ND EXT: warm, right hip tenderness           Additional Data Reviewed: Recent Labs     09/19/14  0703  WBC  8.2  HGB  14.6  PLT  261  NA  138  BUN  12  CREATININE  1.17     Time Total: 60 minutes Greater than 50%  of this time was spent counseling and coordinating care related to the above assessment and plan.  Signed by: Leland Her, DO  Orvis Brill, DO  09/19/2014, 5:33 PM  Please contact Palliative Medicine Team phone at [phone] for questions and concerns.

## 2014-09-19 NOTE — Progress Notes (Signed)
Asked secretary to order PCA pump. Will set up PCA when pump arrives

## 2014-09-19 NOTE — Plan of Care (Signed)
Problem: Consults Goal: Diagnosis- Total Joint Replacement Primary Total Hip Right     

## 2014-09-19 NOTE — Op Note (Signed)
09/19/2014  9:39 AM  PATIENT:  Phillip Cook   MRN: [id number]  PRE-OPERATIVE DIAGNOSIS:  Right hip avascular necrosis with collapse  POST-OPERATIVE DIAGNOSIS:  Same  PROCEDURE:  Procedure(s): TOTAL HIP ARTHROPLASTY  PREOPERATIVE INDICATIONS:    [name] is an 59 y.o. male who has a diagnosis of Right hip avascular necrosis with collapse and elected for surgical management after failing conservative treatment.  The risks benefits and alternatives were discussed with the patient including but not limited to the risks of nonoperative treatment, versus surgical intervention including infection, bleeding, nerve injury, periprosthetic fracture, the need for revision surgery, dislocation, leg length discrepancy, blood clots, cardiopulmonary complications, morbidity, mortality, among others, and they were willing to proceed.     OPERATIVE REPORT     SURGEON:  Marchia Bond, MD    ASSISTANT:  Joya Gaskins, OPA-C  (Present throughout the entire procedure,  necessary for completion of procedure in a timely manner, assisting with retraction, instrumentation, and closure)     ANESTHESIA:  Spinal    COMPLICATIONS:  None.     COMPONENTS:  [employer] fit femur size 6 with a 32 mm +5 ceramic head ball and a gription acetabular shell size 56+4 Neutral polyethylene liner    PROCEDURE IN DETAIL:   The patient was met in the holding area and  identified.  The appropriate hip was identified and marked at the operative site.  The patient was then transported to the OR  and  placed under Spinal anesthesia.  At that point, the patient was  placed in the lateral decubitus position with the operative side up and  secured to the operating room table and all bony prominences padded.     The operative lower extremity was prepped from the iliac crest to the distal leg.  Sterile draping was performed.  Time out was performed prior to incision.      A routine posterolateral approach was utilized via  sharp dissection  carried down to the subcutaneous tissue.  Gross bleeders were Bovie coagulated.  The iliotibial band was identified and incised along the length of the skin incision.  Self-retaining retractors were  inserted.  With the hip internally rotated, the short external rotators  were identified. The piriformis and capsule was tagged with FiberWire, and the hip capsule released in a T-type fashion.  The femoral neck was exposed, and I resected the femoral neck using the appropriate jig. The lateral portion of the cut was directly at the apex of the puriform is fossa, and I did not need to use an osteotome to remove the head.  The femoral head was extremely deformed. The cut was performed at approximately a thumb's breadth above the lesser trochanter.    I then exposed the deep acetabulum, cleared out any tissue including the ligamentum teres.  A wing retractor was placed.  After adequate visualization, I excised the labrum, and then sequentially reamed. The superior acetabulum had a very large defect, measuring about 1.5 x 2 cm. Effectively I had an anterior and posterior column without a superior dome.  I centered the reaming inferiorly, and then medialized appropriately, and ignored my superior defect. At the completion of the reaming, I used bone graft from the reaming to fill the superior void. This was not structural, but biologic.  I placed the trial acetabulum, which seated nicely, and then impacted the real cup into place.  Appropriate version and inclination was confirmed clinically matching their bony anatomy, and also with the use of  the jig.  Given the superior deficit, I did place a screw that was in the posterior superior quadrant, bicortical, 25 mm in length, which had excellent fixation. I did have good purchase with the cup, but given the missing bone I used the screw for back up.  A trial polyethylene liner was placed and the wing retractor removed.    I then prepared the  proximal femur using the cookie-cutter, the lateralizing reamer, and then sequentially reamed and broached.  A trial broach, neck, and head was utilized, and I reduced the hip.  Initially, with the size 5 femur, I got up to an 8.5 neck length, which barely restored the length. For this reason I prepared a 6, and potted it slightly proud, in order to restore length. I then placed the real implant, after placing the real polyethylene liner and central apex hole eliminator. The real implants was then trialed using a +5 head, which provided excellent restoration of leg length and stability. His IT band was already somewhat tight, and I did not feel that he needed a high offset.  I placed a ceramic head due to his nickel allergy. The final implants were placed, and it was found to have excellent stability with functional range of motion.   Leg lengths were restored.  I then used a 2 mm drill bits to pass the FiberWire suture from the capsule and piriformis through the greater trochanter, and secured this. Excellent posterior capsular repair was achieved. I also closed the T in the capsule.  I then irrigated the hip copiously again with pulse lavage, and repaired the fascia with Vicryl, followed by Vicryl for the subcutaneous tissue, Monocryl for the skin, Steri-Strips and sterile gauze. The wounds were injected. The patient was then awakened and returned to PACU in stable and satisfactory condition. There were no complications.In and out catheterization was performed at the completion of the case.  Marchia Bond, MD Orthopedic Surgeon [phone]   09/19/2014 9:39 AM

## 2014-09-19 NOTE — Anesthesia Procedure Notes (Signed)
Spinal Patient location during procedure: OR Staffing Anesthesiologist: Nolon Nations Performed by: anesthesiologist  Preanesthetic Checklist Completed: patient identified, site marked, surgical consent, pre-op evaluation, timeout performed, IV checked, risks and benefits discussed and monitors and equipment checked Spinal Block Patient position: sitting Prep: Betadine Patient monitoring: heart rate, continuous pulse ox and blood pressure Approach: right paramedian Location: L2-3 Injection technique: single-shot Needle Needle type: Sprotte  Needle gauge: 24 G Needle length: 9 cm Additional Notes Expiration date of kit checked and confirmed. Patient tolerated procedure well, without complications.

## 2014-09-19 NOTE — Evaluation (Signed)
Physical Therapy Evaluation Patient Details Name: Phillip Cook MRN: [id number] DOB: 10-19-1955 Today's Date: 09/19/2014   History of Present Illness  Patient is a 59 y/o male s/p R THA, posterior approach. PMH includes HTN, hypothyroidism and arthritis.  Clinical Impression  Mobility assessment limited due to increased pain with light touch or any movement of LEs/hips. Pt quivering, diaphoretic and moaning out loud during repositioning in bed. Attempted to get to EOB however not able to tolerate at this time. Consulting pain doc to assist with pain control/management as pt going to pain clinic prior to surgery. Pt lives alone and will not be able to return home at d/c. Would benefit from ST SNF to maximize independence and mobility prior to return home. Will follow up to perform further mobility evaluation tomorrow as tolerated.      Follow Up Recommendations SNF;Supervision/Assistance - 24 hour    Equipment Recommendations  Other (comment) (TBD)    Recommendations for Other Services OT consult     Precautions / Restrictions Precautions Precautions: Fall;Posterior Hip Precaution Booklet Issued: No Required Braces or Orthoses: Other Brace/Splint Other Brace/Splint: hip abduction pillow - pt unable to tolerate so substituted pillow btw knees for comfort (10/10 pain) Restrictions Weight Bearing Restrictions: Yes RLE Weight Bearing: Weight bearing as tolerated      Mobility  Bed Mobility Overal bed mobility: Needs Assistance Bed Mobility: Supine to Sit           General bed mobility comments: Attempted to get to EOB however after 10 minutes of trying, pt declines, "nope nope, not now, not right now." Re-adjusted pt in bed with pillows under knees and calves and pillow btw knees (to serve a abduction pillow). Total A of 2. Pt able to mobilize LLE minimally.   Transfers Overall transfer level:  (NA due to pain.)                  Ambulation/Gait                 Stairs            Wheelchair Mobility    Modified Rankin (Stroke Patients Only)       Balance Overall balance assessment:  (NA.)                                           Pertinent Vitals/Pain Pain Assessment: Faces Faces Pain Scale: Hurts worst Pain Location: everywhere, right hip, heel Pain Descriptors / Indicators: Sharp;Shooting;Constant;Discomfort;Crying;Grimacing;Guarding;Moaning Pain Intervention(s): Limited activity within patient's tolerance;Monitored during session;Premedicated before session;Repositioned    Home Living Family/patient expects to be discharged to:: Skilled nursing facility                      Prior Function Level of Independence: Independent         Comments: Pt reports living alone and only able to take a few steps up until surgery limited by pain. Was going to a pain clinic.     Hand Dominance        Extremity/Trunk Assessment   Upper Extremity Assessment: Defer to OT evaluation           Lower Extremity Assessment: Generalized weakness (Not able to assess either LEs secondary to sensitivity to touch and increased pain with any movement. Quivering noted throughout.)         Communication   Communication: No  difficulties  Cognition Arousal/Alertness: Awake/alert Behavior During Therapy: WFL for tasks assessed/performed Overall Cognitive Status: Within Functional Limits for tasks assessed                      General Comments      Exercises        Assessment/Plan    PT Assessment Patient needs continued PT services  PT Diagnosis Acute pain;Generalized weakness   PT Problem List Decreased strength;Pain;Decreased range of motion;Decreased activity tolerance;Decreased mobility;Decreased knowledge of precautions  PT Treatment Interventions Balance training;Gait training;Functional mobility training;Therapeutic activities;Therapeutic exercise;Patient/family education;DME  instruction;Stair training   PT Goals (Current goals can be found in the Care Plan section) Acute Rehab PT Goals Patient Stated Goal: to make this pain go away. Get back to life. PT Goal Formulation: With patient Time For Goal Achievement: 10/03/14 Potential to Achieve Goals: Good    Frequency 7X/week   Barriers to discharge Decreased caregiver support      Co-evaluation               End of Session   Activity Tolerance: Patient limited by pain Patient left: in bed;with call bell/phone within reach;with bed alarm set;with family/visitor present;Other (comment) (Palliative care MD inr oom.)           Time: 9357-0177 PT Time Calculation (min) (ACUTE ONLY): 34 min   Charges:   PT Evaluation $Initial PT Evaluation Tier I: 1 Procedure PT Treatments $Therapeutic Activity: 8-22 mins   PT G Codes:        Pau Banh A Norwin Aleman 09/19/2014, 5:19 PM Mylo Red, PT, DPT [phone]

## 2014-09-19 NOTE — Progress Notes (Addendum)
Patient arrived from PACU in extreme pain. Md. Dion Saucier was paged and notified of all the prior PRN pain medications that were given to the patient with no success. He gave an one time order for 2 mg of dilaudid and instructed to give scheduled oxy 15mg  as scheduled. He also informed me that the Pain team were consulted to help administer pain management. Will continue to monitor patient's vital signs and pain.

## 2014-09-19 NOTE — H&P (Signed)
PREOPERATIVE H&P  Chief Complaint: djd right hip  HPI: Phillip Cook is a 59 y.o. male who presents for preoperative history and physical with a diagnosis of djd right hip. Symptoms are rated as moderate to severe, and have been worsening.  This is significantly impairing activities of daily living.  He has elected for surgical management.   He has failed injections, activity modification, anti-inflammatories, narcotics, and assistive devices.  Preoperative X-rays demonstrate end stage degenerative changes with osteophyte formation, avascular collapse, loss of joint space, subchondral sclerosis.   Past Medical History  Diagnosis Date  . Hypertension   . Hypothyroidism   . Arthritis    Past Surgical History  Procedure Laterality Date  . Tonsillectomy    . Arthroscopic  knee Left    History   Social History  . Marital Status: Single    Spouse Name: N/A  . Number of Children: N/A  . Years of Education: N/A   Social History Main Topics  . Smoking status: Never Smoker   . Smokeless tobacco: Not on file  . Alcohol Use: 1.2 oz/week    1 Cans of beer, 1 Glasses of wine per week  . Drug Use: Yes    Special: Marijuana     Comment: many years ago  . Sexual Activity: Not on file   Other Topics Concern  . None   Social History Narrative   History reviewed. No pertinent family history. Allergies  Allergen Reactions  . Codeine Itching  . Other Other (See Comments)    Metal Nickel   Skin becomes red with itching and weeping     Prior to Admission medications   Medication Sig Start Date End Date Taking? Authorizing Provider  amLODipine (NORVASC) 5 MG tablet Take 10 mg by mouth daily. 08/24/14  Yes Historical Provider, MD  amphetamine-dextroamphetamine (ADDERALL) 20 MG tablet Take 20 mg by mouth daily. 08/28/14  Yes Historical Provider, MD  ANDROGEL PUMP 20.25 MG/ACT (1.62%) GEL Apply 4 pumps onto skin daily 08/28/14  Yes Historical Provider, MD  aspirin 325 MG tablet Take 325 mg  by mouth daily.   Yes Historical Provider, MD  levothyroxine (SYNTHROID, LEVOTHROID) 75 MCG tablet Take 150 mcg by mouth 2 (two) times daily. 08/14/14  Yes Historical Provider, MD  lisinopril-hydrochlorothiazide (PRINZIDE,ZESTORETIC) 20-12.5 MG per tablet Take 1 tablet by mouth daily. 08/14/14  Yes Historical Provider, MD  MOVANTIK 25 MG TABS tablet Take 25 mg by mouth every morning. 08/18/14  Yes Historical Provider, MD  OVER THE COUNTER MEDICATION Take 1 tablet by mouth as needed (constipation). OTC laxative   Yes Historical Provider, MD  oxyCODONE (ROXICODONE) 15 MG immediate release tablet Take 15 mg by mouth 6 (six) times daily. 08/28/14  Yes Historical Provider, MD  OXYCONTIN 30 MG T12A Take 30 mg by mouth 2 (two) times daily.  08/28/14  Yes Historical Provider, MD  tiZANidine (ZANAFLEX) 4 MG tablet Take 4 mg by mouth every 8 (eight) hours as needed for muscle spasms.  08/24/14  Yes Historical Provider, MD  zolpidem (AMBIEN) 10 MG tablet Take 10 mg by mouth at bedtime. 08/28/14  Yes Historical Provider, MD     Positive ROS: All other systems have been reviewed and were otherwise negative with the exception of those mentioned in the HPI and as above.  Physical Exam: General: Alert, no acute distress Cardiovascular: No pedal edema Respiratory: No cyanosis, no use of accessory musculature GI: No organomegaly, abdomen is soft and non-tender Skin: No lesions in the area of  chief complaint Neurologic: Sensation intact distally Psychiatric: Patient is competent for consent with normal mood and affect Lymphatic: No axillary or cervical lymphadenopathy  MUSCULOSKELETAL: right hip with AROM 0-80 degrees, no IR or ER 2nd to pain, EHL, FHL intact.  Assessment: djd right hip  Plan: Plan for Procedure(s): TOTAL HIP ARTHROPLASTY  The risks benefits and alternatives were discussed with the patient including but not limited to the risks of nonoperative treatment, versus surgical intervention including  infection, bleeding, nerve injury, periprosthetic fracture, the need for revision surgery, dislocation, leg length discrepancy, blood clots, cardiopulmonary complications, morbidity, mortality, among others, and they were willing to proceed.     Eulas Post, MD Cell [phone]   09/19/2014 7:17 AM

## 2014-09-19 NOTE — Anesthesia Preprocedure Evaluation (Addendum)
Anesthesia Evaluation  Patient identified by MRN, date of birth, ID band Patient awake    Reviewed: Allergy & Precautions, NPO status , Patient's Chart, lab work & pertinent test results  Airway Mallampati: II  TM Distance: >3 FB Neck ROM: Full    Dental no notable dental hx.    Pulmonary neg pulmonary ROS,  breath sounds clear to auscultation  Pulmonary exam normal       Cardiovascular hypertension, Pt. on medications Normal cardiovascular examRhythm:Regular Rate:Normal     Neuro/Psych negative neurological ROS  negative psych ROS   GI/Hepatic negative GI ROS, Neg liver ROS,   Endo/Other  Hypothyroidism   Renal/GU negative Renal ROS  negative genitourinary   Musculoskeletal  (+) Arthritis -,   Abdominal   Peds  Hematology negative hematology ROS (+)   Anesthesia Other Findings   Reproductive/Obstetrics negative OB ROS                            Anesthesia Physical Anesthesia Plan  ASA: II  Anesthesia Plan:    Post-op Pain Management:    Induction:   Airway Management Planned:   Additional Equipment:   Intra-op Plan:   Post-operative Plan:   Informed Consent: I have reviewed the patients History and Physical, chart, labs and discussed the procedure including the risks, benefits and alternatives for the proposed anesthesia with the patient or authorized representative who has indicated his/her understanding and acceptance.   Dental advisory given  Plan Discussed with: CRNA  Anesthesia Plan Comments:         Anesthesia Quick Evaluation

## 2014-09-19 NOTE — Transfer of Care (Signed)
Immediate Anesthesia Transfer of Care Note  Patient: Phillip Cook  Procedure(s) Performed: Procedure(s): TOTAL HIP ARTHROPLASTY (Right)  Patient Location: PACU  Anesthesia Type:Spinal  Level of Consciousness: awake, alert , oriented and patient cooperative  Airway & Oxygen Therapy: Patient Spontanous Breathing and Patient connected to nasal cannula oxygen  Post-op Assessment: Report given to RN, Post -op Vital signs reviewed and stable and Patient moving all extremities  Post vital signs: Reviewed and stable  Last Vitals:  Filed Vitals:   09/19/14 1000  BP: 109/67  Pulse: 61  Temp: 36.5 C  Resp: 22    Complications: No apparent anesthesia complications

## 2014-09-19 NOTE — Care Management (Signed)
Utilization review completed by Laporsche Hoeger N. Deshayla Empson, RN BSN 

## 2014-09-19 NOTE — Progress Notes (Signed)
Pain poorly controlled, I was called by his girlfriend.    Palliative care consult ordered for Cook op narcotic management.    Also spoke with RN to dose his 30mg  oxycontin and 15 mg oxycodone and an additional 2 mg dilaudid iv.  Already gotten dilaudid, valium, and oxycodone.  Will try to get on top of pain ASAP.  Phillip Post, MD

## 2014-09-19 NOTE — Anesthesia Postprocedure Evaluation (Signed)
Anesthesia Post Note  Patient: Phillip Cook  Procedure(s) Performed: Procedure(s) (LRB): TOTAL HIP ARTHROPLASTY (Right)  Anesthesia type: Spinal  Patient location: PACU  Post pain: Pain level controlled  Post assessment: Post-op Vital signs reviewed  Last Vitals: BP 113/63 mmHg  Pulse 70  Temp(Src) 36.7 C (Oral)  Resp 16  Ht 6\' 2"  (1.88 m)  Wt 184 lb (83.462 kg)  BMI 23.61 kg/m2  SpO2 96%  Post vital signs: Reviewed  Level of consciousness: sedated  Complications: No apparent anesthesia complications

## 2014-09-20 ENCOUNTER — Encounter (HOSPITAL_COMMUNITY): Payer: Self-pay | Admitting: General Practice

## 2014-09-20 LAB — BASIC METABOLIC PANEL
ANION GAP: 7 (ref 5–15)
BUN: 9 mg/dL (ref 6–20)
CALCIUM: 9 mg/dL (ref 8.9–10.3)
CHLORIDE: 99 mmol/L — AB (ref 101–111)
CO2: 26 mmol/L (ref 22–32)
Creatinine, Ser: 0.88 mg/dL (ref 0.61–1.24)
GFR calc non Af Amer: >60 mL/min (ref >60)
Glucose, Bld: 140 mg/dL — ABNORMAL HIGH (ref 65–99)
Potassium: 3.9 mmol/L (ref 3.5–5.1)
SODIUM: 132 mmol/L — AB (ref 135–145)

## 2014-09-20 LAB — CBC
HEMATOCRIT: 40.4 % (ref 39.0–52.0)
Hemoglobin: 13.7 g/dL (ref 13.0–17.0)
MCH: 29.1 pg (ref 26.0–34.0)
MCHC: 33.9 g/dL (ref 30.0–36.0)
MCV: 86 fL (ref 78.0–100.0)
Platelets: 276 10*3/uL (ref 150–400)
RBC: 4.7 MIL/uL (ref 4.22–5.81)
RDW: 12.9 % (ref 11.5–15.5)
WBC: 12.2 10*3/uL — AB (ref 4.0–10.5)

## 2014-09-20 MED ORDER — ENSURE ENLIVE PO LIQD
237.0000 mL | Freq: Two times a day (BID) | ORAL | Status: DC
  Administered 2014-09-20 – 2014-09-21 (×2): 237 mL via ORAL

## 2014-09-20 MED ORDER — DIAZEPAM 5 MG PO TABS
5.0000 mg | ORAL_TABLET | Freq: Three times a day (TID) | ORAL | Status: DC | PRN
  Administered 2014-09-20 – 2014-09-21 (×2): 5 mg via ORAL
  Filled 2014-09-20 (×2): qty 1

## 2014-09-20 NOTE — Clinical Social Work Note (Signed)
Clinical Social Work Assessment  Patient Details  Name: Phillip Cook MRN: [id number] Date of Birth: 05/10/1955  Date of referral:  09/20/14               Reason for consult:  Discharge Planning, Facility Placement                Permission sought to share information with:  Facility Contact Representative Permission granted to share information::  Yes, Verbal Permission Granted  Name::     n/a  Agency::  [facility]  Relationship::  n/a  Contact Information:  [phone]  Housing/Transportation Living arrangements for the past 2 months:  Single Family Home Source of Information:  Patient Patient Interpreter Needed:  None Criminal Activity/Legal Involvement Pertinent to Current Situation/Hospitalization:  No - Comment as needed Significant Relationships:  Significant Other Lives with:  Self Do you feel safe going back to the place where you live?  No (High fall risk.) Need for family participation in patient care:  No (Coment) (Patient able to make own decisions.)  Care giving concerns:  Patient expressed no concerns regarding discharge at this time.   Social Worker assessment / plan:  CSW consulted regarding possible SNF placement at time of discharge. CSW met with patient to discuss discharge disposition. Per patient, patient has pre-registered with [facility] SN[facility]. Patient from home alone, but has supportive girlfriend (Teresa). CSW to continue to follow and assist with discharge planning needs.  Employment status:  Other (Comment) (Patient did not disclose patient's employment.) Insurance information:  Other (Comment Required) ([employer]) PT Recommendations:  Skilled Nursing Facility Information / Referral to community resources:  Skilled Nursing Facility  Patient/Family's Response to care:  Patient understanding and agreeable to CSW plan of care.  Patient/Family's Understanding of and Emotional Response to Diagnosis, Current Treatment, and Prognosis:  Patient  understanding and agreeable to CSW plan of care.  Emotional Assessment Appearance:  Appears stated age Attitude/Demeanor/Rapport:  Other (Pleasant.) Affect (typically observed):  Accepting, Appropriate, Pleasant, Happy Orientation:  Oriented to Self, Oriented to Place, Oriented to  Time, Oriented to Situation Alcohol / Substance use:  Not Applicable Psych involvement (Current and /or in the community):  No (Comment) (Not appropriate on this admission.)  Discharge Needs  Concerns to be addressed:  No discharge needs identified Readmission within the last 30 days:  No Current discharge risk:  None Barriers to Discharge:  No Barriers Identified   Vaughn, Emily S, LCSW 09/20/2014, 2:46 PM [phone] 

## 2014-09-20 NOTE — Progress Notes (Signed)
Physical Therapy Treatment Patient Details Name: Phillip Cook MRN: [id number] DOB: 11-24-1955 Today's Date: 09/20/2014    History of Present Illness Patient is a 59 y/o male s/p R THA, posterior approach. PMH includes HTN, hypothyroidism and arthritis.    PT Comments    Patient progressing slowly towards PT goals. Increased time to perform all mobility secondary to pain. Tolerated transfer to chair with Mod A of 2 for safety. Pt with poor pain tolerance. Appropriate for ST SNF. Will continue to follow to maximize independence and mobility.   Follow Up Recommendations  SNF;Supervision/Assistance - 24 hour     Equipment Recommendations  Other (comment)    Recommendations for Other Services       Precautions / Restrictions Precautions Precautions: Fall;Posterior Hip Precaution Booklet Issued: No Required Braces or Orthoses: Other Brace/Splint Other Brace/Splint: hip abduction pillow - pt unable to tolerate so substituted pillow btw knees for comfort (10/10 pain) Restrictions Weight Bearing Restrictions: Yes RLE Weight Bearing: Weight bearing as tolerated    Mobility  Bed Mobility Overal bed mobility: Needs Assistance Bed Mobility: Supine to Sit     Supine to sit: +2 for physical assistance;Max assist     General bed mobility comments: Assist to bring RLE to EOB, elevate trunk and scoot to EOB. Increased time to perform all mobility. Increased pain.   Transfers Overall transfer level: Needs assistance Equipment used: Rolling walker (2 wheeled) Transfers: Sit to/from [employer] Sit to Stand: Mod assist;+2 physical assistance;+2 safety/equipment Stand pivot transfers: Min assist;+2 safety/equipment       General transfer comment: Mod A to rise from EOB with cues for hand/foot placement. Increased time. Min A SPT to chair with multiple short rest breaks. Increased WB BUEs to offload RLE.  Ambulation/Gait                 Stairs             Wheelchair Mobility    Modified Rankin (Stroke Patients Only)       Balance Overall balance assessment: Needs assistance Sitting-balance support: Feet supported;Bilateral upper extremity supported Sitting balance-Leahy Scale: Fair Sitting balance - Comments: Requires BUE support offloading right hip. Postural control: Left lateral lean Standing balance support: During functional activity Standing balance-Leahy Scale: Poor                      Cognition Arousal/Alertness: Awake/alert Behavior During Therapy: WFL for tasks assessed/performed Overall Cognitive Status: Within Functional Limits for tasks assessed       Memory: Decreased short-term memory;Decreased recall of precautions              Exercises Total Joint Exercises Ankle Circles/Pumps: Both;Seated;10 reps    General Comments        Pertinent Vitals/Pain Pain Assessment: Faces Faces Pain Scale: Hurts whole lot Pain Location: right hip Pain Descriptors / Indicators: Sharp;Grimacing;Guarding Pain Intervention(s): Limited activity within patient's tolerance;Monitored during session;Repositioned;PCA encouraged;Premedicated before session    Home Living Family/patient expects to be discharged to:: Other (Comment) ([facility] for Rehab) Living Arrangements: Alone                  Prior Function            PT Goals (current goals can now be found in the care plan section) Progress towards PT goals: Progressing toward goals    Frequency  7X/week    PT Plan Current plan remains appropriate    Co-evaluation  End of Session Equipment Utilized During Treatment: Gait belt Activity Tolerance: Patient limited by pain;Patient tolerated treatment well Patient left: in chair;with call bell/phone within reach;with family/visitor present     Time: 1411-1455 PT Time Calculation (min) (ACUTE ONLY): 44 min  Charges:  $Therapeutic Exercise: 8-22 mins $Therapeutic  Activity: 23-37 mins                    G Codes:      [place] A Claretha Townshend 09/20/2014, 3:23 PM  Mylo Red, PT, DPT [phone]

## 2014-09-20 NOTE — Clinical Social Work Placement (Signed)
   CLINICAL SOCIAL WORK PLACEMENT  NOTE  Date:  09/20/2014  Patient Details  Name: Phillip Cook MRN: [id number] Date of Birth: [birth year]  Clinical Social Work is seeking post-discharge placement for this patient at the Skilled  Nursing Facility level of care (*CSW will initial, date and re-position this form in  chart as items are completed):  Yes   Patient/family provided with [facility] Clinical Social Work Department's list of facilities offering this level of care within the geographic area requested by the patient (or if unable, by the patient's family).  Yes   Patient/family informed of their freedom to choose among providers that offer the needed level of care, that participate in Medicare, Medicaid or managed care program needed by the patient, have an available bed and are willing to accept the patient.  Yes   Patient/family informed of 's ownership interest in [facility] and [facility], as well as of the fact that they are under no obligation to receive care at these facilities.  PASRR submitted to EDS on 09/20/14     PASRR number received on 09/20/14     Existing PASRR number confirmed on  (n/a)     FL2 transmitted to all facilities in geographic area requested by pt/family on 09/20/14     FL2 transmitted to all facilities within larger geographic area on  (n/a)     Patient informed that his/her managed care company has contracts with or will negotiate with certain facilities, including the following:   (yes, [facility])     Yes   Patient/family informed of bed offers received.  Patient chooses bed at [facility]     Physician recommends and patient chooses bed at  (n/a)    Patient to be transferred to [facility] on  .  Patient to be transferred to facility by [facility]     Patient family notified on   of transfer.  Name of family member notified:        PHYSICIAN Please sign FL2     Additional Comment:     _______________________________________________ Rod Mae, LCSW 09/20/2014, 2:54 PM [phone]

## 2014-09-20 NOTE — Progress Notes (Signed)
Daily Progress Note   Patient Name: Phillip Cook       Date: 09/20/2014 DOB: [birth year]  Age: 59 y.o. MRN#: [id number] Attending Physician: Teryl Lucy, MD Primary Care Physician: Marga Melnick, MD Admit Date: 09/19/2014  Reason for Consultation/Follow-up: Pain control  Subjective: States that pain is better but rates as 7-8/10 currently and since PCA started.  Frustrated that it did not get setup until almost midnight.  Not moved bowels and not passing gas yet.  Denies N/V, pruritis, dyspnea.  Reports that at tims pushing button makes him sleepy.    Length of Stay: 1 day  Current Medications: Scheduled Meds:  . amLODipine  10 mg Oral Daily  . amphetamine-dextroamphetamine  20 mg Oral Daily  . lisinopril  20 mg Oral Daily   And  . hydrochlorothiazide  12.5 mg Oral Daily  . HYDROmorphone PCA 0.3 mg/mL   Intravenous 6 times per day  . levothyroxine  150 mcg Oral BID  . naloxegol oxalate  25 mg Oral Daily  . rivaroxaban  10 mg Oral Q breakfast  . senna-docusate  2 tablet Oral BID  . Testosterone  20.25 mg Transdermal Daily  . zolpidem  10 mg Oral QHS    Continuous Infusions: . 0.45 % NaCl with KCl 20 mEq / L 75 mL/hr at 09/20/14 0600    PRN Meds: acetaminophen **OR** acetaminophen, alum & mag hydroxide-simeth, bisacodyl, diazepam, diphenhydrAMINE, HYDROmorphone (DILAUDID) injection, menthol-cetylpyridinium **OR** phenol, metoCLOPramide **OR** metoCLOPramide (REGLAN) injection, naloxone **AND** sodium chloride, ondansetron **OR** ondansetron (ZOFRAN) IV, polyethylene glycol, tiZANidine    Vital Signs: BP 129/80 mmHg  Pulse 79  Temp(Src) 100.6 F (38.1 C) (Oral)  Resp 8  Ht  (1.88 m)  Wt 83.462 kg (184 lb)  BMI 23.61 kg/m2  SpO2 95% SpO2: SpO2: 95 % O2 Device: O2 Device: Nasal Cannula O2 Flow Rate: O2 Flow Rate (L/min): 2 L/min  Intake/output summary:  Intake/Output Summary (Last 24 hours) at 09/20/14 0942 Last data filed at 09/20/14 0600  Gross per 24 hour    Intake   1730 ml  Output    550 ml  Net   1180 ml   Baseline Weight: Weight: 83.462 kg (184 lb) Most recent weight: Weight: 83.462 kg (184 lb)  Physical Exam: GEN: alert, NAD HEENT: [place], sclera anicteric CV: reg rate LUNGS: normal respiratory rate with inaccurate ETCO2 monitoring.  ABD: soft, ND EXT: warm          Additional Data Reviewed: Recent Labs     09/19/14  0703  09/20/14  0539  WBC  8.2  12.2*  HGB  14.6  13.7  PLT  261  276  NA  138  132*  BUN  12  9  CREATININE  1.17  0.88     Problem List:  Patient Active Problem List   Diagnosis Date Noted  . Primary localized osteoarthritis of right hip 09/19/2014  . Hip arthritis 09/19/2014  . Palliative care patient   . Chronic pain syndrome   . Post-operative pain   . Constipation due to opioid therapy   . History of ETOH abuse      Palliative Care Assessment & Plan  Clinical Assessment/Narrative: 59 yo male with DJD s/p arthroplasty complicated by post-op pain, chronic pain, substance abuse  Symptom Management:   Acute Post-Op Pain on Chronic Pain- see initial consult.  Unfortunately, PCA did not get setup until almost midnight and he likely had gap where pain control was poor (i took away  multiple pain meds).  Feels like he is doing better this morning and certainly appears much more comfortable and no longer diaphoretic.  His ETCO2 monitor is alarming for low respiratory rate but this appears inaccurate.  It states he is breathing 3-4x's/minute while he is completely alert and conversant with me.  I have asked nursing to replace ETCO2 monitor.  See my initial note for complexities of this case. With his history, I suspect pain control may be a long and protracted course.  Will keep him on PCA today at current dosing and re-assess where he is at tomorrow. In past 8hrs he has requested 14 demands and received 12 bolus doses. Again set expectations that pain control will not be perfect but we have to shoot for it to  be tolerable so he can function.  I am not sure if this is accurate, but he states he was being awoken overnight and encouraged to push button.  This is frankly dangerous should not happen while someone is on PCA.  He is allowed to push button if he awakens, but should not be awoken to do so.  I think we should be cautious with muscle relaxants and watch benzo use closely as well.  I am going to decrease valium to q8h PRN.    Continue Dilaudid 0.4mg /hr with 0.6mg  bolus for now.    Opioid Induced Constipation- continue regimen, monitor.   Substance Abuse- monitor closely. Has prn benzo.     Total Time: 30 minutes Greater than 50%  of this time was spent counseling and coordinating care related to the above assessment and plan.   Orvis Brill, DO  09/20/2014, 9:42 AM  Please contact Palliative Medicine Team phone at [phone] for questions and concerns.

## 2014-09-20 NOTE — Progress Notes (Signed)
Patient ID: Phillip Cook, male   DOB: Dec 13, 1955, 59 y.o.   MRN: [id number]     Subjective:  Patient reports pain as moderate to severe.  Patient denies any CP or SOB.  Sleeping and in no acute distress.  He does wake to follow commands but very lethargic.   Objective:   VITALS:   Filed Vitals:   09/19/14 2048 09/19/14 2308 09/20/14 0200 09/20/14 0519  BP: 139/71  126/71 129/80  Pulse: 83  81 79  Temp: 98.1 F (36.7 C)  100.2 F (37.9 C) 100.6 F (38.1 C)  TempSrc:      Resp: 16 16 16 14   Height:      Weight:      SpO2: 97% 97% 97% 97%    ABD soft Sensation intact distally Dorsiflexion/Plantar flexion intact Incision: dressing C/D/I and no drainage Good foot and ankle motion.  Lab Results  Component Value Date   WBC 12.2* 09/20/2014   HGB 13.7 09/20/2014   HCT 40.4 09/20/2014   MCV 86.0 09/20/2014   PLT 276 09/20/2014   BMET    Component Value Date/Time   NA 132* 09/20/2014 0539   K 3.9 09/20/2014 0539   CL 99* 09/20/2014 0539   CO2 26 09/20/2014 0539   GLUCOSE 140* 09/20/2014 0539   BUN 9 09/20/2014 0539   CREATININE 0.88 09/20/2014 0539   CALCIUM 9.0 09/20/2014 0539   GFRNONAA >60 09/20/2014 0539   GFRAA >60 09/20/2014 0539     Assessment/Plan: 1 Day Post-Op   Principal Problem:   Primary localized osteoarthritis of right hip Active Problems:   Hip arthritis   Palliative care patient   Chronic pain syndrome   Post-operative pain   Constipation due to opioid therapy   History of ETOH abuse   Advance diet Up with therapy The input from Pall. Care Team is greatly appreciated  WBAT Dry dressing PRN Plan for SNF placement   DOUGLAS Janace Litten 09/20/2014, 7:08 AM   Seen and agree.  Overall much better pain controlled.    Teryl Lucy, MD Cell [phone]

## 2014-09-20 NOTE — Progress Notes (Signed)
OT Cancellation Note  Patient Details Name: Phillip Cook MRN: [id number] DOB: 03-23-1956   Cancelled Treatment:    Reason Eval/Treat Not Completed: OT screened. Pt's current D/C plan is SNF. No apparent immediate acute care OT needs, therefore will defer OT to SNF. If OT eval is needed please call Acute Rehab Dept. at [phone] or text page OT at [phone].    Earlie Raveling OTR/L [phone] 09/20/2014, 1:48 PM

## 2014-09-21 DIAGNOSIS — Z515 Encounter for palliative care: Secondary | ICD-10-CM | POA: Insufficient documentation

## 2014-09-21 DIAGNOSIS — K5909 Other constipation: Secondary | ICD-10-CM

## 2014-09-21 DIAGNOSIS — T402X5A Adverse effect of other opioids, initial encounter: Secondary | ICD-10-CM

## 2014-09-21 DIAGNOSIS — M1611 Unilateral primary osteoarthritis, right hip: Principal | ICD-10-CM

## 2014-09-21 DIAGNOSIS — G894 Chronic pain syndrome: Secondary | ICD-10-CM

## 2014-09-21 LAB — CBC
HCT: 38.2 % — ABNORMAL LOW (ref 39.0–52.0)
Hemoglobin: 13 g/dL (ref 13.0–17.0)
MCH: 29.3 pg (ref 26.0–34.0)
MCHC: 34 g/dL (ref 30.0–36.0)
MCV: 86 fL (ref 78.0–100.0)
Platelets: 292 10*3/uL (ref 150–400)
RBC: 4.44 MIL/uL (ref 4.22–5.81)
RDW: 13 % (ref 11.5–15.5)
WBC: 14.3 10*3/uL — AB (ref 4.0–10.5)

## 2014-09-21 LAB — BASIC METABOLIC PANEL
ANION GAP: 7 (ref 5–15)
BUN: 15 mg/dL (ref 6–20)
CALCIUM: 9 mg/dL (ref 8.9–10.3)
CO2: 27 mmol/L (ref 22–32)
Chloride: 100 mmol/L — ABNORMAL LOW (ref 101–111)
Creatinine, Ser: 0.86 mg/dL (ref 0.61–1.24)
GFR calc Af Amer: >60 mL/min (ref >60)
Glucose, Bld: 131 mg/dL — ABNORMAL HIGH (ref 65–99)
Potassium: 4.3 mmol/L (ref 3.5–5.1)
SODIUM: 134 mmol/L — AB (ref 135–145)

## 2014-09-21 MED ORDER — OXYCODONE HCL 5 MG PO TABS: 15.0000 mg | ORAL_TABLET | ORAL | Status: DC | PRN

## 2014-09-21 MED ORDER — OXYCODONE HCL 5 MG PO TABS
20.0000 mg | ORAL_TABLET | ORAL | Status: DC | PRN
  Administered 2014-09-21 – 2014-09-22 (×5): 20 mg via ORAL
  Filled 2014-09-21 (×5): qty 4

## 2014-09-21 MED ORDER — HYDROMORPHONE HCL 1 MG/ML IJ SOLN: 1.0000 mg | INTRAMUSCULAR | Status: DC | PRN

## 2014-09-21 MED ORDER — OXYCODONE HCL ER 40 MG PO T12A
60.0000 mg | EXTENDED_RELEASE_TABLET | Freq: Two times a day (BID) | ORAL | Status: DC
  Administered 2014-09-21 – 2014-09-22 (×2): 60 mg via ORAL
  Filled 2014-09-21: qty 1
  Filled 2014-09-21 (×2): qty 2
  Filled 2014-09-21: qty 1

## 2014-09-21 MED ORDER — ENSURE ENLIVE PO LIQD
237.0000 mL | Freq: Three times a day (TID) | ORAL | Status: DC
  Administered 2014-09-21 – 2014-09-22 (×3): 237 mL via ORAL

## 2014-09-21 MED ORDER — OXYCODONE HCL ER 40 MG PO T12A
40.0000 mg | EXTENDED_RELEASE_TABLET | Freq: Two times a day (BID) | ORAL | Status: DC
  Administered 2014-09-21: 40 mg via ORAL
  Filled 2014-09-21: qty 1

## 2014-09-21 MED ORDER — BISACODYL 10 MG RE SUPP
10.0000 mg | Freq: Every day | RECTAL | Status: DC | PRN
  Administered 2014-09-22: 10 mg via RECTAL
  Filled 2014-09-21: qty 1

## 2014-09-21 NOTE — Progress Notes (Addendum)
Patient ID: Phillip Cook, male   DOB: [birth year], 59 y.o.   MRN: [id number]     Subjective:  Patient reports pain as mild to moderate.  Patient states that the pain is much better controlled but is still requesting more pain medicine.  Patient in bed and in no acute distress.  Denies any CP or SOB  Objective:   VITALS:   Filed Vitals:   09/20/14 1300 09/20/14 1600 09/20/14 2042 09/21/14 0518  BP: 137/81  112/75 109/61  Pulse: 86  96 74  Temp:   98.6 F (37 C) 99.2 F (37.3 C)  TempSrc: Oral     Resp: 18 18 18 18   Height:      Weight:      SpO2: 95% 97% 96% 97%    ABD soft Sensation intact distally Dorsiflexion/Plantar flexion intact Incision: dressing C/D/I and no drainage Good foot and ankle motion.    Lab Results  Component Value Date   WBC 12.2* 09/20/2014   HGB 13.7 09/20/2014   HCT 40.4 09/20/2014   MCV 86.0 09/20/2014   PLT 276 09/20/2014   BMET    Component Value Date/Time   NA 132* 09/20/2014 0539   K 3.9 09/20/2014 0539   CL 99* 09/20/2014 0539   CO2 26 09/20/2014 0539   GLUCOSE 140* 09/20/2014 0539   BUN 9 09/20/2014 0539   CREATININE 0.88 09/20/2014 0539   CALCIUM 9.0 09/20/2014 0539   GFRNONAA >60 09/20/2014 0539   GFRAA >60 09/20/2014 0539     Assessment/Plan: 2 Days Post-Op   Principal Problem:   Primary localized osteoarthritis of right hip Active Problems:   Hip arthritis   Palliative care patient   Chronic pain syndrome   Post-operative pain   Constipation due to opioid therapy   History of ETOH abuse   Advance diet Up with therapy Plan for discharge tomorrow WBAT Dry dressing PRN Plan for SNF placement on Friday Had a long and frank discussion with the patient regarding the dangers of these drugs and continued requests for stronger drugs.  Haskel Khan 09/21/2014, 6:54 AM  Seen and agree.   Defer to palliative care for pain / pca management. SNF likely fri, but he will need to be off pca/iv pain meds.     Teryl Lucy, MD Cell [phone]

## 2014-09-21 NOTE — Progress Notes (Signed)
Physical Therapy Treatment Patient Details Name: Phillip Cook MRN: [id number] DOB: 1955/05/29 Today's Date: 09/21/2014    History of Present Illness Patient is a 59 y/o male s/p R THA, posterior approach. PMH includes HTN, hypothyroidism and arthritis.    PT Comments    Patient progressing slowly with mobility. Pain seems improved today however pt concerned about the PCA pump being discontinued. Continues to require assist with transfers and ambulation. Reviewed exercises and hip precautions. Will continue to benefit from ST SNF to maximize independence and mobility.   Follow Up Recommendations  SNF;Supervision/Assistance - 24 hour     Equipment Recommendations  Other (comment)    Recommendations for Other Services       Precautions / Restrictions Precautions Precautions: Fall;Posterior Hip Precaution Booklet Issued: No Restrictions Weight Bearing Restrictions: Yes RLE Weight Bearing: Weight bearing as tolerated    Mobility  Bed Mobility Overal bed mobility: Needs Assistance       Supine to sit: Min assist;HOB elevated     General bed mobility comments: Increased time to perform. Step by step cues for transfer. Min A for trunk elevation and to help with RLE.  Transfers Overall transfer level: Needs assistance Equipment used: Rolling walker (2 wheeled) Transfers: Sit to/from Stand Sit to Stand: Min assist         General transfer comment: Min A to boost up from EOB with cues for hand placement. Increased time and 2 attempts due to pain. Crepitus in left knee and back with upright posture.  Ambulation/Gait Ambulation/Gait assistance: Min assist Ambulation Distance (Feet): 12 Feet Assistive device: Rolling walker (2 wheeled) Gait Pattern/deviations: Step-to pattern;Decreased stance time - right;Decreased step length - left;Trunk flexed;Decreased weight shift to right   Gait velocity interpretation: Below normal speed for age/gender General Gait Details: Cues  for RW management and right knee extension. Manual cues for upright posture.    Stairs            Wheelchair Mobility    Modified Rankin (Stroke Patients Only)       Balance Overall balance assessment: Needs assistance Sitting-balance support: Feet supported;No upper extremity supported Sitting balance-Leahy Scale: Fair     Standing balance support: During functional activity Standing balance-Leahy Scale: Poor                      Cognition Arousal/Alertness: Awake/alert Behavior During Therapy: WFL for tasks assessed/performed Overall Cognitive Status: Within Functional Limits for tasks assessed       Memory: Decreased short-term memory;Decreased recall of precautions              Exercises Total Joint Exercises Ankle Circles/Pumps: Both;10 reps;Supine Quad Sets: Right;10 reps;Supine Short Arc Quad: Right;10 reps;Supine    General Comments        Pertinent Vitals/Pain Pain Assessment: Faces Faces Pain Scale: Hurts little more Pain Location: right hip Pain Descriptors / Indicators: Sore;Aching Pain Intervention(s): Premedicated before session;Repositioned;Monitored during session;PCA encouraged    Home Living                      Prior Function            PT Goals (current goals can now be found in the care plan section) Progress towards PT goals: Progressing toward goals    Frequency  7X/week    PT Plan Current plan remains appropriate    Co-evaluation             End of Session Equipment Utilized  During Treatment: Gait belt Activity Tolerance: Patient limited by pain;Patient tolerated treatment well Patient left: in chair;with call bell/phone within reach     Time: 1037-1115 PT Time Calculation (min) (ACUTE ONLY): 38 min  Charges:  $Gait Training: 8-22 mins $Therapeutic Exercise: 8-22 mins $Therapeutic Activity: 8-22 mins                    G Codes:      Elsi Stelzer A Aryiana Klinkner 09/21/2014, 12:59 PM  Mylo Red, PT, DPT [phone]

## 2014-09-21 NOTE — Progress Notes (Signed)
Orthopedic Tech Progress Note Patient Details:  Phillip Cook October 08, 1955 [id number]  Ortho Devices Ortho Device/Splint Location: trapeze bar patient helper Ortho Device/Splint Interventions: Application   Nikki Dom 09/21/2014, 8:34 AM

## 2014-09-21 NOTE — Progress Notes (Signed)
Initial Nutrition Assessment  INTERVENTION:  Ensure Enlive (each supplement provides 350kcal and 20 grams of protein)  NUTRITION DIAGNOSIS:  Increased nutrient needs related to acute illness as evidenced by per patient/family report, meal completion < 50%.  GOAL:  Patient will meet greater than or equal to 90% of their needs  MONITOR:  PO intake, Supplement acceptance, Labs, Weight trends, I & O's  REASON FOR ASSESSMENT:  Malnutrition Screening Tool    ASSESSMENT: Pt is a 59 y.o. male who presents for preoperative history and physical with a diagnosis of djd right hip. 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 Per pt he eats only one meal per day (at night), but double portions. Diet recall shows pt is very health conscious and prepares most of his meals from fresh ingredients. Pt has also been very constipated due to opioids he takes for pain. Nutrition focused exam was inconclusive due to pt standing (as part of therapy), one calf visibly atrophied due to using the other leg due to hip pain. No notable fat or muscle depletion otherwise, may need to repeat the exam if pt remains hospitalized. Pt states his medications makes him not hungry, encouraged consuming protein shakes and snacks every few hours, especially while recovering from the surgery. Pt does like Ensure, will increase order to TID. PO has been recorded as 75%, however his girlfriend admits to eating some, sometimes most of his food. She has been bringing several things he will eat. Emphasized the importance of good PO and protein intake for healing. Pt very grateful for the visit, will continue to monitor.  Labs reviewed: Na 134, Glu 131 - 140      Height:  Ht Readings from Last 1 Encounters:  09/19/14 6\' 2"  (1.88 m)    Weight:  Wt Readings from Last 1 Encounters:   09/19/14 184 lb (83.462 kg)    Ideal Body Weight:  86 kg  Wt Readings from Last 10 Encounters:  09/19/14 184 lb (83.462 kg)  09/08/14 184 lb 4.9 oz (83.601 kg)    BMI:  Body mass index is 23.61 kg/(m^2).  Estimated Nutritional Needs:  Kcal:  2300 - 2500  Protein:  105 - 115 g  Fluid:  2.0 L  Skin:  Reviewed, no issues (surgical incision)  Diet Order:  Diet regular Room service appropriate?: Yes; Fluid consistency:: Thin  EDUCATION NEEDS:  Education needs addressed   Intake/Output Summary (Last 24 hours) at 09/21/14 1552 Last data filed at 09/21/14 0908  Gross per 24 hour  Intake    120 ml  Output      0 ml  Net    120 ml    Last BM:  6/13  Phillip Cook A. [facility] Dietetic Intern Pager: [phone] 09/21/2014 4:02 PM

## 2014-09-21 NOTE — Progress Notes (Signed)
Daily Progress Note   Patient Name: Phillip Cook       Date: 09/21/2014 DOB: [birth year]  Age: 58 y.o. MRN#: [id number] Attending Physician: Teryl Lucy, MD Primary Care Physician: Marga Melnick, MD Admit Date: 09/19/2014  Reason for Consultation/Follow-up: Pain control  Subjective: States that pain is better controlled since the past 24 hours. It is almost a 2-3/10 when the patient is resting in bed. how ever, patient voices that he wants to be OOB, wants to participate in rehab. He is aware of his impending D/C to SNF rehab (? [facility]) within the next 24 hours.   PCA will be D/C in 4-5 hours today. Discussed with patient who is agreeable to current plan. Pain regimen will consist of OxyContin PO scheduled, PO PRN Oxycodone, he will have rescue/ break-through- pain only IV Dilaudid PRN still available. Discussed with RN.   Not moved bowels and not passing gas yet.  Denies N/V, pruritis, dyspnea. Consider PRN use of Dulcolax rectal suppository, discussed with RN.   Length of Stay: 2 days  Current Medications: Scheduled Meds:  . amLODipine  10 mg Oral Daily  . amphetamine-dextroamphetamine  20 mg Oral Daily  . feeding supplement (ENSURE ENLIVE)  237 mL Oral BID BM  . lisinopril  20 mg Oral Daily   And  . hydrochlorothiazide  12.5 mg Oral Daily  . HYDROmorphone PCA 0.3 mg/mL   Intravenous 6 times per day  . levothyroxine  150 mcg Oral BID  . naloxegol oxalate  25 mg Oral Daily  . OxyCODONE  40 mg Oral Q12H  . rivaroxaban  10 mg Oral Q breakfast  . senna-docusate  2 tablet Oral BID  . Testosterone  20.25 mg Transdermal Daily  . zolpidem  10 mg Oral QHS    Continuous Infusions: . 0.45 % NaCl with KCl 20 mEq / L 75 mL/hr at 09/20/14 0600    PRN Meds: acetaminophen **OR** acetaminophen, alum & mag hydroxide-simeth, bisacodyl, diazepam, diphenhydrAMINE, HYDROmorphone (DILAUDID) injection, menthol-cetylpyridinium **OR** phenol, metoCLOPramide **OR** metoCLOPramide (REGLAN)  injection, naloxone **AND** sodium chloride, ondansetron **OR** ondansetron (ZOFRAN) IV, oxyCODONE, polyethylene glycol, tiZANidine    Vital Signs: BP 109/61 mmHg  Pulse 74  Temp(Src) 99.2 F (37.3 C) (Oral)  Resp 19  Ht  (1.88 m)  Wt 83.462 kg (184 lb)  BMI 23.61 kg/m2  SpO2 20% SpO2: SpO2: (!) 20 % O2 Device: O2 Device: Nasal Cannula O2 Flow Rate: O2 Flow Rate (L/min): 2 L/min  Intake/output summary: No intake or output data in the 24 hours ending 09/21/14 1046 Baseline Weight: Weight: 83.462 kg (184 lb) Most recent weight: Weight: 83.462 kg (184 lb)  Physical Exam: GEN: alert, NAD HEENT: [place], sclera anicteric CV: reg rate LUNGS: normal respiratory rate with inaccurate ETCO2 monitoring.  ABD: soft, ND EXT: warm          Additional Data Reviewed: Recent Labs     09/20/14  0539  09/21/14  0623  WBC  12.2*  14.3*  HGB  13.7  13.0  PLT  276  292  NA  132*  134*  BUN  9  15  CREATININE  0.88  0.86     Problem List:  Patient Active Problem List   Diagnosis Date Noted  . Primary localized osteoarthritis of right hip 09/19/2014  . Hip arthritis 09/19/2014  . Palliative care patient   . Chronic pain syndrome   . Post-operative pain   . Constipation due to opioid therapy   . History  of ETOH abuse      Palliative Care Assessment & Plan  Clinical Assessment/Narrative: 59 yo male with DJD s/p arthroplasty complicated by post-op pain, chronic pain, substance abuse  Symptom Management:   Acute Post-Op Pain on Chronic Pain- see initial palliative consult for additional details. With the patient's history, he is aware that pain control may be a long and protracted course.  We continue to set expectations that pain control will not be perfect but we have to shoot for it to be tolerable so he can function.    D/C Dilaudid 0.4mg /hr with 0.6mg  bolus after the current syringe is finished. Then we will switch to PO OxyContin and PO PRN Oxycodone, he will still have IV  Dilaudid available.    Opioid Induced Constipation- continue regimen, monitor. Consider rectal Dulcolax suppository use.   Substance Abuse- monitor closely. Has prn benzo.    Final recommendation, as discussed extensively with the patient is for him to establish with a pain clinic and consider methadone. The patient is motivated to pursue rehab and to increase his functional status. 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   Total Time: 35 minutes Greater than 50%  of this time was spent counseling and coordinating care related to the above assessment and plan.   Rosalin Hawking, MD  09/21/2014, 10:46 AM  Please contact Palliative Medicine Team phone at [phone] for questions and concerns.

## 2014-09-22 LAB — CBC
HCT: 36.9 % — ABNORMAL LOW (ref 39.0–52.0)
Hemoglobin: 12.2 g/dL — ABNORMAL LOW (ref 13.0–17.0)
MCH: 29 pg (ref 26.0–34.0)
MCHC: 33.1 g/dL (ref 30.0–36.0)
MCV: 87.6 fL (ref 78.0–100.0)
Platelets: 303 10*3/uL (ref 150–400)
RBC: 4.21 MIL/uL — ABNORMAL LOW (ref 4.22–5.81)
RDW: 13.2 % (ref 11.5–15.5)
WBC: 11.7 10*3/uL — ABNORMAL HIGH (ref 4.0–10.5)

## 2014-09-22 NOTE — Clinical Social Work Placement (Signed)
   CLINICAL SOCIAL WORK PLACEMENT  NOTE  Date:  09/22/2014  Patient Details  Name: Phillip Cook MRN: [id number] Date of Birth: 1955-04-13  Clinical Social Work is seeking post-discharge placement for this patient at the Skilled  Nursing Facility level of care (*CSW will initial, date and re-position this form in  chart as items are completed):  Yes   Patient/family provided with [place] Clinical Social Work Department's list of facilities offering this level of care within the geographic area requested by the patient (or if unable, by the patient's family).  Yes   Patient/family informed of their freedom to choose among providers that offer the needed level of care, that participate in Medicare, Medicaid or managed care program needed by the patient, have an available bed and are willing to accept the patient.  Yes   Patient/family informed of [facility]'s ownership interest in [facility] and [facility], as well as of the fact that they are under no obligation to receive care at these facilities.  PASRR submitted to EDS on 09/20/14     PASRR number received on 09/20/14     Existing PASRR number confirmed on  (n/a)     FL2 transmitted to all facilities in geographic area requested by pt/family on 09/20/14     FL2 transmitted to all facilities within larger geographic area on  (n/a)     Patient informed that his/her managed care company has contracts with or will negotiate with certain facilities, including the following:   (yes, [facility])     Yes   Patient/family informed of bed offers received.  Patient chooses bed at [facility]     Physician recommends and patient chooses bed at  (n/a)    Patient to be transferred to [facility] on 09/22/14.  Patient to be transferred to facility by [facility]     Patient family notified on 09/22/14 of transfer.  Name of family member notified:  Patient alert and oriented, updated at bedside.     PHYSICIAN        Additional Comment:    _______________________________________________ Rod Mae, LCSW 09/22/2014, 12:03 PM [phone]

## 2014-09-22 NOTE — Progress Notes (Signed)
[name] discharged to [facility] per MD order. Discharge instructions reviewed and discussed with patient. All questions and concerns answered. Copy of instructions and scripts given to patient. IV removed.  Patient escorted to car by [facility] on stretcher. No distress noted upon discharge.   Dennard Nip R 09/22/2014 2:27 PM

## 2014-09-22 NOTE — Discharge Summary (Signed)
Physician Discharge Summary  Patient ID: Phillip Cook MRN: [id number] DOB/AGE: [birth year] 60 y.o.  Admit date: 09/19/2014 Discharge date: 09/22/2014  Admission Diagnoses:  Primary localized osteoarthritis of right hip  Discharge Diagnoses:  Principal Problem:   Primary localized osteoarthritis of right hip Active Problems:   Hip arthritis   Palliative care patient   Chronic pain syndrome   Post-operative pain   Constipation due to opioid therapy   History of ETOH abuse   Encounter for palliative care   Past Medical History  Diagnosis Date  . Hypertension   . Hypothyroidism   . Arthritis   . Primary localized osteoarthritis of right hip 09/19/2014    Surgeries: Procedure(s): TOTAL HIP ARTHROPLASTY on 09/19/2014   Consultants (if any): Treatment Team:  Palliative [facility]  Discharged Condition: Improved  Hospital Course: Phillip Cook is an 59 y.o. male who was admitted 09/19/2014 with a diagnosis of Primary localized osteoarthritis of right hip and went to the operating room on 09/19/2014 and underwent the above named procedures.    He was given perioperative antibiotics:  Anti-infectives    Start     Dose/Rate Route Frequency Ordered Stop   09/19/14 1330  ceFAZolin (ANCEF) IVPB 2 g/50 mL premix     2 g 100 mL/hr over 30 Minutes Intravenous Every 6 hours 09/19/14 1249 09/19/14 2057   09/19/14 0700  ceFAZolin (ANCEF) IVPB 2 g/50 mL premix     2 g 100 mL/hr over 30 Minutes Intravenous On call to O.R. 09/19/14 4098 09/19/14 0733    .  He was given sequential compression devices, early ambulation, and xarelto for DVT prophylaxis.   We had extreme difficulty controlling postop pain and requested help from the palliative care team, who assisted and managed his PCA.  He benefited maximally from the hospital stay and there were no complications.    Recent vital signs:  Filed Vitals:   09/22/14 0507  BP: 110/51  Pulse: 83  Temp: 98.8 F (37.1 C)  Resp: 16    Recent  laboratory studies:  Lab Results  Component Value Date   HGB 12.2* 09/22/2014   HGB 13.0 09/21/2014   HGB 13.7 09/20/2014   Lab Results  Component Value Date   WBC 11.7* 09/22/2014   PLT 303 09/22/2014   No results found for: INR Lab Results  Component Value Date   NA 134* 09/21/2014   K 4.3 09/21/2014   CL 100* 09/21/2014   CO2 27 09/21/2014   BUN 15 09/21/2014   CREATININE 0.86 09/21/2014   GLUCOSE 131* 09/21/2014    Discharge Medications:     Medication List    STOP taking these medications        aspirin 325 MG tablet      TAKE these medications        amLODipine 5 MG tablet  Commonly known as:  NORVASC  Take 10 mg by mouth daily.     amphetamine-dextroamphetamine 20 MG tablet  Commonly known as:  ADDERALL  Take 20 mg by mouth daily.     ANDROGEL PUMP 20.25 MG/ACT (1.62%) Gel  Generic drug:  Testosterone  Apply 4 pumps onto skin daily     baclofen 10 MG tablet  Commonly known as:  LIORESAL  Take 1 tablet (10 mg total) by mouth 3 (three) times daily. As needed for muscle spasm     diazepam 5 MG tablet  Commonly known as:  VALIUM  Take 1 tablet (5 mg total) by mouth every 6 (six)  hours as needed for anxiety.     HYDROmorphone 2 MG tablet  Commonly known as:  DILAUDID  Take 1 tablet (2 mg total) by mouth every 4 (four) hours as needed for severe pain.     levothyroxine 75 MCG tablet  Commonly known as:  SYNTHROID, LEVOTHROID  Take 150 mcg by mouth 2 (two) times daily.     lisinopril-hydrochlorothiazide 20-12.5 MG per tablet  Commonly known as:  PRINZIDE,ZESTORETIC  Take 1 tablet by mouth daily.     MOVANTIK 25 MG Tabs tablet  Generic drug:  naloxegol oxalate  Take 25 mg by mouth every morning.     ondansetron 4 MG tablet  Commonly known as:  ZOFRAN  Take 1 tablet (4 mg total) by mouth every 8 (eight) hours as needed for nausea or vomiting.     OVER THE COUNTER MEDICATION  Take 1 tablet by mouth as needed (constipation). OTC laxative      oxyCODONE 15 MG immediate release tablet  Commonly known as:  ROXICODONE  Take 1-2 tablets (15-30 mg total) by mouth 6 (six) times daily.     OXYCONTIN 30 MG T12a  Generic drug:  OxyCODONE HCl ER  Take 30 mg by mouth 2 (two) times daily.     rivaroxaban 10 MG Tabs tablet  Commonly known as:  XARELTO  Take 1 tablet (10 mg total) by mouth daily.     sennosides-docusate sodium 8.6-50 MG tablet  Commonly known as:  SENOKOT-S  Take 2 tablets by mouth daily.     tiZANidine 4 MG tablet  Commonly known as:  ZANAFLEX  Take 4 mg by mouth every 8 (eight) hours as needed for muscle spasms.     zolpidem 10 MG tablet  Commonly known as:  AMBIEN  Take 10 mg by mouth at bedtime.        Diagnostic Studies: Dg Pelvis Portable  09/19/2014   CLINICAL DATA:  Post hip replacement  EXAM: PORTABLE PELVIS 1-2 VIEWS  COMPARISON:  None.  FINDINGS: Staples and radiopaque possible soft tissue foreign bodies project over the perineal region. Right total hip arthroplasty is in place. No evidence for hardware failure or fracture.  IMPRESSION: Expected postoperative appearance after right total hip arthroplasty.   Electronically Signed   By: Christiana Pellant M.D.   On: 09/19/2014 11:05    Disposition: Final discharge disposition not confirmed      Discharge Instructions    Posterior total hip precautions    Complete by:  As directed      Weight bearing as tolerated    Complete by:  As directed            Follow-up Information    Follow up with Eulas Post, MD. Schedule an appointment as soon as possible for a visit in 2 weeks.   Specialty:  Orthopedic Surgery   Contact information:   [address] [place] [address] [phone]        Signed: Eulas Post 09/22/2014, 6:52 AM

## 2014-09-22 NOTE — Progress Notes (Signed)
Physical Therapy Treatment Patient Details Name: Naszir Cott MRN: [id number] DOB: [birth year] Today's Date: 09/22/2014    History of Present Illness Patient is a 59 y/o male s/p R THA, posterior approach. PMH includes HTN, hypothyroidism and arthritis.    PT Comments    Patient progressing better today with ambulation. Still limited by pain. Increased education on HEP. Continue to recommend SNF for ongoing Physical Therapy.     Follow Up Recommendations  SNF;Supervision/Assistance - 24 hour     Equipment Recommendations  Other (comment)    Recommendations for Other Services       Precautions / Restrictions Precautions Precautions: Fall;Posterior Hip Precaution Comments: Patient able to recall2/3 hip precautions. Cues for no toes in Restrictions Weight Bearing Restrictions: Yes RLE Weight Bearing: Weight bearing as tolerated    Mobility  Bed Mobility Overal bed mobility: Needs Assistance Bed Mobility: Supine to Sit     Supine to sit: Min assist;HOB elevated     General bed mobility comments: Increased time to perform. Step by step cues for transfer. Min A for trunk elevation and to help with RLE.  Transfers Overall transfer level: Needs assistance Equipment used: Rolling walker (2 wheeled)   Sit to Stand: Min assist         General transfer comment: Min A to boost up from EOB with cues for hand placement. Crepitus in left knee and back with upright posture.  Ambulation/Gait Ambulation/Gait assistance: Min guard Ambulation Distance (Feet): 60 Feet Assistive device: Rolling walker (2 wheeled) Gait Pattern/deviations: Decreased stance time - right;Decreased step length - left;Trunk flexed   Gait velocity interpretation: Below normal speed for age/gender General Gait Details: Cues for RW management. patient tends to lift BLE and use UE to swing through. Max education on step through pattern.    Stairs            Wheelchair Mobility    Modified Rankin  (Stroke Patients Only)       Balance                                    Cognition Arousal/Alertness: Awake/alert Behavior During Therapy: WFL for tasks assessed/performed Overall Cognitive Status: Within Functional Limits for tasks assessed                      Exercises Total Joint Exercises Quad Sets: Right;10 reps;Supine Gluteal Sets: AROM;Both;10 reps Heel Slides: AAROM;Right;5 reps Hip ABduction/ADduction: AAROM;Right;5 reps    General Comments        Pertinent Vitals/Pain Faces Pain Scale: Hurts little more Pain Location: R hip Pain Descriptors / Indicators: Aching;Sore Pain Intervention(s): Monitored during session    Home Living                      Prior Function            PT Goals (current goals can now be found in the care plan section) Progress towards PT goals: Progressing toward goals    Frequency  7X/week    PT Plan Current plan remains appropriate    Co-evaluation             End of Session Equipment Utilized During Treatment: Gait belt Activity Tolerance: Patient tolerated treatment well Patient left: in chair;with call bell/phone within reach     Time: 0945-1009 PT Time Calculation (min) (ACUTE ONLY): 24 min  Charges:  $Gait Training: 8-22  mins $Therapeutic Exercise: 8-22 mins                    G Codes:      Fredrich Birks 09/22/2014, 11:56 AM 09/22/2014 Fredrich Birks PTA [phone] pager [phone] office

## 2014-09-22 NOTE — Discharge Planning (Signed)
Patient to be discharged to [facility]. Patient updated regarding discharge.  Facility: [facility] RN report number: [id number] Transportation: EMS Sharin Mons) scheduled for 2pm per facility bed availability  Marcelline Deist, [place] - [phone] Clinical Social Work Department Orthopedics [phone]) and Surgical [phone])

## 2014-09-22 NOTE — Discharge Instructions (Signed)
INSTRUCTIONS AFTER JOINT REPLACEMENT  ° °o Remove items at home which could result in a fall. This includes throw rugs or furniture in walking pathways °o ICE to the affected joint every three hours while awake for 30 minutes at a time, for at least the first 3-5 days, and then as needed for pain and swelling.  Continue to use ice for pain and swelling. You may notice swelling that will progress down to the foot and ankle.  This is normal after surgery.  Elevate your leg when you are not up walking on it.   °o Continue to use the breathing machine you got in the hospital (incentive spirometer) which will help keep your temperature down.  It is common for your temperature to cycle up and down following surgery, especially at night when you are not up moving around and exerting yourself.  The breathing machine keeps your lungs expanded and your temperature down. ° ° °DIET:  As you were doing prior to hospitalization, we recommend a well-balanced diet. ° °DRESSING / WOUND CARE / SHOWERING ° °You may change your dressing 3-5 days after surgery.  Then change the dressing every day with sterile gauze.  Please use good hand washing techniques before changing the dressing.  Do not use any lotions or creams on the incision until instructed by your surgeon. ° °ACTIVITY ° °o Increase activity slowly as tolerated, but follow the weight bearing instructions below.   °o No driving for 6 weeks or until further direction given by your physician.  You cannot drive while taking narcotics.  °o No lifting or carrying greater than 10 lbs. until further directed by your surgeon. °o Avoid periods of inactivity such as sitting longer than an hour when not asleep. This helps prevent blood clots.  °o You may return to work once you are authorized by your doctor.  ° ° ° °WEIGHT BEARING  ° °Weight bearing as tolerated with assist device (walker, cane, etc) as directed, use it as long as suggested by your surgeon or therapist, typically at  least 4-6 weeks. ° ° °EXERCISES ° °Results after joint replacement surgery are often greatly improved when you follow the exercise, range of motion and muscle strengthening exercises prescribed by your doctor. Safety measures are also important to protect the joint from further injury. Any time any of these exercises cause you to have increased pain or swelling, decrease what you are doing until you are comfortable again and then slowly increase them. If you have problems or questions, call your caregiver or physical therapist for advice.  ° °Rehabilitation is important following a joint replacement. After just a few days of immobilization, the muscles of the leg can become weakened and shrink (atrophy).  These exercises are designed to build up the tone and strength of the thigh and leg muscles and to improve motion. Often times heat used for twenty to thirty minutes before working out will loosen up your tissues and help with improving the range of motion but do not use heat for the first two weeks following surgery (sometimes heat can increase post-operative swelling).  ° °These exercises can be done on a training (exercise) mat, on the floor, on a table or on a bed. Use whatever works the best and is most comfortable for you.    Use music or television while you are exercising so that the exercises are a pleasant break in your day. This will make your life better with the exercises acting as a break   in your routine that you can look forward to.   Perform all exercises about fifteen times, three times per day or as directed.  You should exercise both the operative leg and the other leg as well. ° °Exercises include: °  °• Quad Sets - Tighten up the muscle on the front of the thigh (Quad) and hold for 5-10 seconds.   °• Straight Leg Raises - With your knee straight (if you were given a brace, keep it on), lift the leg to 60 degrees, hold for 3 seconds, and slowly lower the leg.  Perform this exercise against  resistance later as your leg gets stronger.  °• Leg Slides: Lying on your back, slowly slide your foot toward your buttocks, bending your knee up off the floor (only go as far as is comfortable). Then slowly slide your foot back down until your leg is flat on the floor again.  °• Angel Wings: Lying on your back spread your legs to the side as far apart as you can without causing discomfort.  °• Hamstring Strength:  Lying on your back, push your heel against the floor with your leg straight by tightening up the muscles of your buttocks.  Repeat, but this time bend your knee to a comfortable angle, and push your heel against the floor.  You may put a pillow under the heel to make it more comfortable if necessary.  ° °A rehabilitation program following joint replacement surgery can speed recovery and prevent re-injury in the future due to weakened muscles. Contact your doctor or a physical therapist for more information on knee rehabilitation.  ° ° °CONSTIPATION ° °Constipation is defined medically as fewer than three stools per week and severe constipation as less than one stool per week.  Even if you have a regular bowel pattern at home, your normal regimen is likely to be disrupted due to multiple reasons following surgery.  Combination of anesthesia, postoperative narcotics, change in appetite and fluid intake all can affect your bowels.  ° °YOU MUST use at least one of the following options; they are listed in order of increasing strength to get the job done.  They are all available over the counter, and you may need to use some, POSSIBLY even all of these options:   ° °Drink plenty of fluids (prune juice may be helpful) and high fiber foods °Colace 100 mg by mouth twice a day  °Senokot for constipation as directed and as needed Dulcolax (bisacodyl), take with full glass of water  °Miralax (polyethylene glycol) once or twice a day as needed. ° °If you have tried all these things and are unable to have a bowel  movement in the first 3-4 days after surgery call either your surgeon or your primary doctor.   ° °If you experience loose stools or diarrhea, hold the medications until you stool forms back up.  If your symptoms do not get better within 1 week or if they get worse, check with your doctor.  If you experience "the worst abdominal pain ever" or develop nausea or vomiting, please contact the office immediately for further recommendations for treatment. ° ° °ITCHING:  If you experience itching with your medications, try taking only a single pain pill, or even half a pain pill at a time.  You can also use Benadryl over the counter for itching or also to help with sleep.  ° °TED HOSE STOCKINGS:  Use stockings on both legs until for at least 2 weeks or as   directed by physician office. They may be removed at night for sleeping.  MEDICATIONS:  See your medication summary on the After Visit Summary that nursing will review with you.  You may have some home medications which will be placed on hold until you complete the course of blood thinner medication.  It is important for you to complete the blood thinner medication as prescribed.  PRECAUTIONS:  If you experience chest pain or shortness of breath - call 911 immediately for transfer to the hospital emergency department.   If you develop a fever greater that 101 F, purulent drainage from wound, increased redness or drainage from wound, foul odor from the wound/dressing, or calf pain - CONTACT YOUR SURGEON.                                                   FOLLOW-UP APPOINTMENTS:  If you do not already have a post-op appointment, please call the office for an appointment to be seen by your surgeon.  Guidelines for how soon to be seen are listed in your After Visit Summary, but are typically between 1-4 weeks after surgery.  OTHER INSTRUCTIONS:   Knee Replacement:  Do not place pillow under knee, focus on keeping the knee straight while resting. CPM  instructions: 0-90 degrees, 2 hours in the morning, 2 hours in the afternoon, and 2 hours in the evening. Place foam block, curve side up under heel at all times except when in CPM or when walking.  DO NOT modify, tear, cut, or change the foam block in any way.  MAKE SURE YOU:   Understand these instructions.   Get help right away if you are not doing well or get worse.    Thank you for letting us be a part of your medical care team.  It is a privilege we respect greatly.  We hope these instructions will help you stay on track for a fast and full recovery!    Information on my medicine - XARELTO (Rivaroxaban)  This medication education was reviewed with me or my healthcare representative as part of my discharge preparation.  The pharmacist that spoke with me during my hospital stay was:  Fayne Norrie, [facility]  Why was Xarelto prescribed for you? Xarelto was prescribed for you to reduce the risk of blood clots forming after orthopedic surgery. The medical term for these abnormal blood clots is venous thromboembolism (VTE).  What do you need to know about xarelto ? Take your Xarelto ONCE DAILY at the same time every day. You may take it either with or without food.  If you have difficulty swallowing the tablet whole, you may crush it and mix in applesauce just prior to taking your dose.  Take Xarelto exactly as prescribed by your doctor and DO NOT stop taking Xarelto without talking to the doctor who prescribed the medication.  Stopping without other VTE prevention medication to take the place of Xarelto may increase your risk of developing a clot.  After discharge, you should have regular check-up appointments with your healthcare provider that is prescribing your Xarelto.    What do you do if you miss a dose? If you miss a dose, take it as soon as you remember on the same day then continue your regularly scheduled once daily regimen the next day. Do not take two doses  of  Xarelto on the same day.   Important Safety Information A possible side effect of Xarelto is bleeding. You should call your healthcare provider right away if you experience any of the following: ? Bleeding from an injury or your nose that does not stop. ? Unusual colored urine (red or dark brown) or unusual colored stools (red or black). ? Unusual bruising for unknown reasons. ? A serious fall or if you hit your head (even if there is no bleeding).  Some medicines may interact with Xarelto and might increase your risk of bleeding while on Xarelto. To help avoid this, consult your healthcare provider or pharmacist prior to using any new prescription or non-prescription medications, including herbals, vitamins, non-steroidal anti-inflammatory drugs (NSAIDs) and supplements.  This website has more information on Xarelto: VisitDestination.com.br.  **NO interactions noted between Androgel (testosterone) and Xarelto.

## 2014-09-22 NOTE — Progress Notes (Signed)
Daily Progress Note   Patient Name: Phillip Cook       Date: 09/22/2014 DOB: Apr 13, 1955  Age: 59 y.o. MRN#: [id number] Attending Physician: Teryl Lucy, MD Primary Care Physician: Marga Melnick, MD Admit Date: 09/19/2014  Reason for Consultation/Follow-up: Pain control  Subjective: Pain well controlled, off the PCA. Patient now on PO regimen, he is tolerating well Appetite increasing  He is aware of his impending D/C to SNF rehab (? [facility]) today.     Discussed with RN.  " I will be rehabbing like a demon."   Not moved bowels  .  Denies N/V, pruritis, dyspnea. Consider PRN use of Dulcolax rectal suppository, discussed with RN.   Length of Stay: 3 days  Current Medications: Scheduled Meds:  . amLODipine  10 mg Oral Daily  . amphetamine-dextroamphetamine  20 mg Oral Daily  . feeding supplement (ENSURE ENLIVE)  237 mL Oral TID BM  . lisinopril  20 mg Oral Daily   And  . hydrochlorothiazide  12.5 mg Oral Daily  . levothyroxine  150 mcg Oral BID  . naloxegol oxalate  25 mg Oral Daily  . OxyCODONE  60 mg Oral Q12H  . rivaroxaban  10 mg Oral Q breakfast  . senna-docusate  2 tablet Oral BID  . Testosterone  20.25 mg Transdermal Daily  . zolpidem  10 mg Oral QHS    Continuous Infusions: . 0.45 % NaCl with KCl 20 mEq / L 75 mL/hr at 09/20/14 0600    PRN Meds: acetaminophen **OR** acetaminophen, alum & mag hydroxide-simeth, bisacodyl, diazepam, diphenhydrAMINE, HYDROmorphone (DILAUDID) injection, menthol-cetylpyridinium **OR** phenol, metoCLOPramide **OR** metoCLOPramide (REGLAN) injection, ondansetron **OR** ondansetron (ZOFRAN) IV, oxyCODONE, polyethylene glycol, tiZANidine    Vital Signs: BP 110/51 mmHg  Pulse 83  Temp(Src) 98.8 F (37.1 C) (Oral)  Resp 16  Ht  (1.88 m)  Wt 83.462 kg (184 lb)  BMI 23.61 kg/m2  SpO2 94% SpO2: SpO2: 94 % O2 Device: O2 Device: Not Delivered O2 Flow Rate: O2 Flow Rate (L/min): 2 L/min  Intake/output summary:    Intake/Output Summary (Last 24 hours) at 09/22/14 0943 Last data filed at 09/22/14 0700  Gross per 24 hour  Intake    600 ml  Output   1350 ml  Net   -750 ml   Baseline Weight: Weight: 83.462 kg (184 lb) Most recent weight: Weight: 83.462 kg (184 lb)  Physical Exam: GEN: alert, NAD HEENT: [place], sclera anicteric CV: reg rate LUNGS: breathing ok, clear ABD: soft, ND EXT: warm          Additional Data Reviewed: Recent Labs     09/20/14  0539  09/21/14  0623  09/22/14  0357  WBC  12.2*  14.3*  11.7*  HGB  13.7  13.0  12.2*  PLT  276  292  303  NA  132*  134*   --   BUN  9  15   --   CREATININE  0.88  0.86   --      Problem List:  Patient Active Problem List   Diagnosis Date Noted  . Encounter for palliative care   . Primary localized osteoarthritis of right hip 09/19/2014  . Hip arthritis 09/19/2014  . Palliative care patient   . Chronic pain syndrome   . Post-operative pain   . Constipation due to opioid therapy   . History of ETOH abuse      Palliative Care Assessment & Plan  Clinical Assessment/Narrative: 59 yo male with  DJD s/p arthroplasty complicated by post-op pain, chronic pain, substance abuse  Symptom Management:   Acute Post-Op Pain on Chronic Pain- see initial palliative consult for additional details. With the patient's history, he is aware that pain control may be a long and protracted course.  We continue to set expectations that pain control will not be perfect but we have to shoot for it to be tolerable so he can function.    Now off PCA, well controlled pain on oral regimen. Ok for D/C from palliative standpoint.   Opioid Induced Constipation- continue regimen, monitor. Consider rectal Dulcolax suppository use.   Substance Abuse- monitor closely. Has prn benzo.    Final recommendation, as discussed extensively with the patient is for him to establish with a pain clinic and consider methadone. The patient is motivated to pursue rehab and to  increase his functional status. 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   Total Time: 35 minutes Greater than 50%  of this time was spent counseling and coordinating care related to the above assessment and plan.   Rosalin Hawking, MD  09/22/2014, 9:43 AM  Please contact Palliative Medicine Team phone at [phone] for questions and concerns.

## 2014-09-22 NOTE — Progress Notes (Signed)
Patient ID: Phillip Cook, male   DOB: [birth year], 59 y.o.   MRN: [id number]     Subjective:  Patient reports pain as mild.  Patient in bed and in no acute distress.  Patient states that he is doing much better.  Patient continues to try to negotiate an increase in his pain meds.  He also is very concerned about the blood work being drawn wantint to make sure that these are not Drug Screens.  Objective:   VITALS:   Filed Vitals:   09/21/14 1530 09/21/14 1640 09/21/14 2010 09/22/14 0507  BP: 102/64  108/63 110/51  Pulse: 86  77 83  Temp: 99.1 F (37.3 C)  98.9 F (37.2 C) 98.8 F (37.1 C)  TempSrc:      Resp: 18 18 16 16   Height:      Weight:      SpO2: 100% 96% 99% 94%    ABD soft Sensation intact distally Dorsiflexion/Plantar flexion intact Incision: dressing C/D/I and no drainage Wound clean no sign of infection  Lab Results  Component Value Date   WBC 11.7* 09/22/2014   HGB 12.2* 09/22/2014   HCT 36.9* 09/22/2014   MCV 87.6 09/22/2014   PLT 303 09/22/2014   BMET    Component Value Date/Time   NA 134* 09/21/2014 0623   K 4.3 09/21/2014 0623   CL 100* 09/21/2014 0623   CO2 27 09/21/2014 0623   GLUCOSE 131* 09/21/2014 0623   BUN 15 09/21/2014 0623   CREATININE 0.86 09/21/2014 0623   CALCIUM 9.0 09/21/2014 0623   GFRNONAA >60 09/21/2014 0623   GFRAA >60 09/21/2014 0623     Assessment/Plan: 3 Days Post-Op   Principal Problem:   Primary localized osteoarthritis of right hip Active Problems:   Hip arthritis   Palliative care patient   Chronic pain syndrome   Post-operative pain   Constipation due to opioid therapy   History of ETOH abuse   Encounter for palliative care   Advance diet Up with therapy Discharge to SNF  Will continue to defer to palliative care for pain until patient leaves for SNF.  Plan seems to be in place for all of these issues for the patient. WBAT Dry dressing PRN.   Haskel Khan 09/22/2014, 6:43 AM  Discussed  and agree with above.  Teryl Lucy, MD Cell [phone]

## 2014-09-25 ENCOUNTER — Encounter: Payer: Self-pay | Admitting: Adult Health

## 2014-09-25 ENCOUNTER — Non-Acute Institutional Stay (SKILLED_NURSING_FACILITY): Payer: 59 | Admitting: Adult Health

## 2014-09-25 DIAGNOSIS — T402X5A Adverse effect of other opioids, initial encounter: Secondary | ICD-10-CM

## 2014-09-25 DIAGNOSIS — F419 Anxiety disorder, unspecified: Secondary | ICD-10-CM | POA: Diagnosis not present

## 2014-09-25 DIAGNOSIS — K5903 Drug induced constipation: Secondary | ICD-10-CM

## 2014-09-25 DIAGNOSIS — D72829 Elevated white blood cell count, unspecified: Secondary | ICD-10-CM | POA: Diagnosis not present

## 2014-09-25 DIAGNOSIS — K5909 Other constipation: Secondary | ICD-10-CM

## 2014-09-25 DIAGNOSIS — G4700 Insomnia, unspecified: Secondary | ICD-10-CM | POA: Diagnosis not present

## 2014-09-25 DIAGNOSIS — F909 Attention-deficit hyperactivity disorder, unspecified type: Secondary | ICD-10-CM | POA: Diagnosis not present

## 2014-09-25 DIAGNOSIS — F988 Other specified behavioral and emotional disorders with onset usually occurring in childhood and adolescence: Secondary | ICD-10-CM

## 2014-09-25 DIAGNOSIS — M1611 Unilateral primary osteoarthritis, right hip: Secondary | ICD-10-CM | POA: Diagnosis not present

## 2014-09-25 DIAGNOSIS — I10 Essential (primary) hypertension: Secondary | ICD-10-CM | POA: Diagnosis not present

## 2014-09-25 DIAGNOSIS — R7989 Other specified abnormal findings of blood chemistry: Secondary | ICD-10-CM

## 2014-09-25 DIAGNOSIS — E291 Testicular hypofunction: Secondary | ICD-10-CM

## 2014-09-25 DIAGNOSIS — G894 Chronic pain syndrome: Secondary | ICD-10-CM

## 2014-09-25 DIAGNOSIS — E039 Hypothyroidism, unspecified: Secondary | ICD-10-CM | POA: Diagnosis not present

## 2014-09-26 ENCOUNTER — Non-Acute Institutional Stay (SKILLED_NURSING_FACILITY): Payer: 59 | Admitting: Internal Medicine

## 2014-09-26 DIAGNOSIS — R21 Rash and other nonspecific skin eruption: Secondary | ICD-10-CM | POA: Diagnosis not present

## 2014-09-26 DIAGNOSIS — I10 Essential (primary) hypertension: Secondary | ICD-10-CM | POA: Diagnosis not present

## 2014-09-26 DIAGNOSIS — K5909 Other constipation: Secondary | ICD-10-CM

## 2014-09-26 DIAGNOSIS — G894 Chronic pain syndrome: Secondary | ICD-10-CM | POA: Diagnosis not present

## 2014-09-26 DIAGNOSIS — E039 Hypothyroidism, unspecified: Secondary | ICD-10-CM

## 2014-09-26 DIAGNOSIS — F909 Attention-deficit hyperactivity disorder, unspecified type: Secondary | ICD-10-CM | POA: Diagnosis not present

## 2014-09-26 DIAGNOSIS — G4700 Insomnia, unspecified: Secondary | ICD-10-CM | POA: Diagnosis not present

## 2014-09-26 DIAGNOSIS — M1611 Unilateral primary osteoarthritis, right hip: Secondary | ICD-10-CM

## 2014-09-26 DIAGNOSIS — K5903 Drug induced constipation: Secondary | ICD-10-CM

## 2014-09-26 DIAGNOSIS — F988 Other specified behavioral and emotional disorders with onset usually occurring in childhood and adolescence: Secondary | ICD-10-CM

## 2014-09-26 DIAGNOSIS — D62 Acute posthemorrhagic anemia: Secondary | ICD-10-CM | POA: Diagnosis not present

## 2014-09-26 DIAGNOSIS — T402X5A Adverse effect of other opioids, initial encounter: Secondary | ICD-10-CM | POA: Diagnosis not present

## 2014-09-26 NOTE — Progress Notes (Signed)
Patient ID: Jorgeluis Mukes, male   DOB: [birth year], 59 y.o.   MRN: [id number]   09/25/14  Facility:  Nursing Home Location:  [facility] Room Number: 705-P LEVEL OF CARE:  SNF (31)   Chief Complaint  Patient presents with  . Hospitalization Follow-up    Osteoarthritis S/P right total hip arthroplasty, hypertension, chronic pain, ADD, anxiety, opioid-induced constipation, insomnia, leukocytosis and hypothyroidism    HISTORY OF PRESENT ILLNESS:  This is a 59 year old male who has been admitted to [facility] on 09/22/14 with osteoarthritis S/P right total hip arthroplasty. He has PMH of hypertension, hypothyroidism and arthritis.  He verbalized that this pain is well controlled. He reported that he has history of BPH and would like his PSA to be checked.  He has been admitted for a short-term rehabilitation.  PAST MEDICAL HISTORY:  Past Medical History  Diagnosis Date  . Hypertension   . Hypothyroidism   . Arthritis   . Primary localized osteoarthritis of right hip 09/19/2014    CURRENT MEDICATIONS: Reviewed per MAR/see medication list  Allergies  Allergen Reactions  . Codeine Itching  . Other Other (See Comments)    Metal Nickel   Skin becomes red with itching and weeping       REVIEW OF SYSTEMS:  GENERAL: no change in appetite, no fatigue, no weight changes, no fever, chills or weakness RESPIRATORY: no cough, SOB, DOE, wheezing, hemoptysis CARDIAC: no chest pain, edema or palpitations GI: no abdominal pain, diarrhea, heart burn, nausea or vomiting, + constipation  PHYSICAL EXAMINATION  GENERAL: no acute distress, normal body habitus SKIN:  Right hip surgical wound has aquacel dressing, dry, no erythema EYES: conjunctivae normal, sclerae normal, normal eye lids NECK: supple, trachea midline, no neck masses, no thyroid tenderness, no thyromegaly LYMPHATICS: no LAN in the neck, no supraclavicular LAN RESPIRATORY: breathing is even &  unlabored, BS CTAB CARDIAC: RRR, no murmur,no extra heart sounds, no edema GI: abdomen soft, normal BS, no masses, no tenderness, no hepatomegaly, no splenomegaly EXTREMITIES:  Able to move 4 extremities PSYCHIATRIC: the patient is alert & oriented to person, affect & behavior appropriate  LABS/RADIOLOGY: Labs reviewed: Basic Metabolic Panel:  Recent Labs  78/24/23 0703 09/20/14 0539 09/21/14 0623  NA 138 132* 134*  K 3.6 3.9 4.3  CL 103 99* 100*  CO2 28 26 27   GLUCOSE 110* 140* 131*  BUN 12 9 15   CREATININE 1.17 0.88 0.86  CALCIUM 9.3 9.0 9.0    CBC:  Recent Labs  09/20/14 0539 09/21/14 0623 09/22/14 0357  WBC 12.2* 14.3* 11.7*  HGB 13.7 13.0 12.2*  HCT 40.4 38.2* 36.9*  MCV 86.0 86.0 87.6  PLT 276 292 303    Dg Pelvis Portable  09/19/2014   CLINICAL DATA:  Post hip replacement  EXAM: PORTABLE PELVIS 1-2 VIEWS  COMPARISON:  None.  FINDINGS: Staples and radiopaque possible soft tissue foreign bodies project over the perineal region. Right total hip arthroplasty is in place. No evidence for hardware failure or fracture.  IMPRESSION: Expected postoperative appearance after right total hip arthroplasty.   Electronically Signed   By: Christiana Pellant M.D.   On: 09/19/2014 11:05    ASSESSMENT/PLAN:  Osteoarthritis S/P right total hip arthroplasty - for rehabilitation; follow-up with Dr. Dion Saucier, orthopedic surgeon, in 2 weeks; continue Xarelto 10 mg 1 tab by mouth daily for DVT prophylaxis; Zanaflex 4 mg by mouth every 8 hours when necessary and baclofen 10 mg 3 times a day when  necessary for muscle spasm; Dilaudid 2 mg 1 tab by mouth every 4 hours when necessary and oxycodone 15 mg 1 tab by mouth every 6 hours routine and oxycodone 15 mg 1 tab by mouth every 6 hours when necessary for pain Chronic pain - continue OxyContin 30 mg ER 1 tab by mouth every 12 hours Hypertension - continue Norvasc 10 mg 1 tab by mouth daily and lisinopril-HCTZ 20-12.5 mg 1 tab by mouth daily ADD  - continue Adderall 20 mg by mouth daily Anxiety - mood is stable; continue Valium 5 mg 1 tab by mouth every 6 hours when necessary Opioid-induced constipation - continue Movantik 25 mg 1 tab by mouth daily, increase senna S to 2 tabs by mouth twice a day and MiraLAX 17 g by mouth daily Insomnia -  continue Ambien 10 mg 1 tab by mouth daily at bedtime Leukocytosis - wbc 11.7;  will monitor Hypothyroidism - continue Synthroid 150 g by mouth daily Low testosterone - continue on AndroGel pump 20.25 mg/ACT gel 4 pumps on 2 skin daily   Goals of care:  Short-term rehabilitation   Labs/test ordered: CBC, CMP, TSH and PSA  Spent 50 minutes in patient care.    [facility], NP [employer] [phone]

## 2014-09-26 NOTE — Progress Notes (Signed)
Patient ID: Phillip Cook, male   DOB: 1955/09/30, 59 y.o.   MRN: [id number]     [facility]   PCP: Marga Melnick, MD  Code Status: full code  Allergies  Allergen Reactions  . Codeine Itching  . Other Other (See Comments)    Metal Nickel   Skin becomes red with itching and weeping      Chief Complaint  Patient presents with  . New Admit To SNF    new admission     HPI:  59 year old patient is here for short term rehabilitation post hospital admission from 09/19/14-09/22/14 with primary OA of right hip. He underwent right total hip arthroplasty. His pain is under control with current regimen. He has not had a shower and would like one. He complaints of itching in his back. He had a bowel movement yesterday. He has PMH of alcohol use and chronic pain syndrome. He is followed in pain clinic as outpatient  Review of Systems:  Constitutional: Negative for fever, chills, diaphoresis.  HENT: Negative for headache, congestion, nasal discharge Eyes: Negative for eye pain, blurred vision, double vision and discharge.  Respiratory: Negative for cough, shortness of breath and wheezing.   Cardiovascular: Negative for chest pain, palpitations, leg swelling.  Gastrointestinal: Negative for heartburn, nausea, vomiting, abdominal pain Genitourinary: Negative for dysuria Musculoskeletal: Negative for back pain, falls Neurological: Negative for dizziness, tingling, focal weakness Psychiatric/Behavioral: Negative for depression   Past Medical History  Diagnosis Date  . Hypertension   . Hypothyroidism   . Arthritis   . Primary localized osteoarthritis of right hip 09/19/2014   Past Surgical History  Procedure Laterality Date  . Tonsillectomy    . Arthroscopic  knee Left   . Total hip arthroplasty Right 09/19/2014  . Colonoscopy    . Total hip arthroplasty Right 09/19/2014    Procedure: TOTAL HIP ARTHROPLASTY;  Surgeon: Teryl Lucy, MD;  Location: [facility] OR;   Service: Orthopedics;  Laterality: Right;   Social History:   reports that he has never smoked. He has never used smokeless tobacco. He reports that he drinks about 1.2 oz of alcohol per week. He reports that he uses illicit drugs (Marijuana).  No family history on file.  Medications: Patient's Medications  New Prescriptions   No medications on file  Previous Medications   AMLODIPINE (NORVASC) 5 MG TABLET    Take 10 mg by mouth daily.   AMPHETAMINE-DEXTROAMPHETAMINE (ADDERALL) 20 MG TABLET    Take 20 mg by mouth daily.   ANDROGEL PUMP 20.25 MG/ACT (1.62%) GEL    Apply 4 pumps onto skin daily   BACLOFEN (LIORESAL) 10 MG TABLET    Take 1 tablet (10 mg total) by mouth 3 (three) times daily. As needed for muscle spasm   DIAZEPAM (VALIUM) 5 MG TABLET    Take 1 tablet (5 mg total) by mouth every 6 (six) hours as needed for anxiety.   HYDROMORPHONE (DILAUDID) 2 MG TABLET    Take 1 tablet (2 mg total) by mouth every 4 (four) hours as needed for severe pain.   LEVOTHYROXINE (SYNTHROID, LEVOTHROID) 75 MCG TABLET    Take 150 mcg by mouth 2 (two) times daily.   LISINOPRIL-HYDROCHLOROTHIAZIDE (PRINZIDE,ZESTORETIC) 20-12.5 MG PER TABLET    Take 1 tablet by mouth daily.   MOVANTIK 25 MG TABS TABLET    Take 25 mg by mouth every morning.   ONDANSETRON (ZOFRAN) 4 MG TABLET    Take 1 tablet (4 mg total)  by mouth every 8 (eight) hours as needed for nausea or vomiting.   OVER THE COUNTER MEDICATION    Take 1 tablet by mouth as needed (constipation). OTC laxative   OXYCODONE (ROXICODONE) 15 MG IMMEDIATE RELEASE TABLET    Take 1-2 tablets (15-30 mg total) by mouth 6 (six) times daily.   OXYCONTIN 30 MG T12A    Take 30 mg by mouth 2 (two) times daily.   RIVAROXABAN (XARELTO) 10 MG TABS TABLET    Take 1 tablet (10 mg total) by mouth daily.   SENNOSIDES-DOCUSATE SODIUM (SENOKOT-S) 8.6-50 MG TABLET    Take 2 tablets by mouth daily.   TIZANIDINE (ZANAFLEX) 4 MG TABLET    Take 4 mg by mouth every 8 (eight) hours as  needed for muscle spasms.    ZOLPIDEM (AMBIEN) 10 MG TABLET    Take 10 mg by mouth at bedtime.  Modified Medications   No medications on file  Discontinued Medications   No medications on file     Physical Exam: Filed Vitals:   09/26/14 1603  BP: 113/70  Pulse: 82  Temp: 99.3 F (37.4 C)  Resp: 18  Weight: 179 lb 12.8 oz (81.557 kg)  SpO2: 96%    General- elderly male, well built, in no acute distress Head- normocephalic, atraumatic Throat- moist mucus membrane Eyes- no pallor, no icterus, no discharge, normal conjunctiva Neck- no cervical lymphadenopathy, no jugular vein distension, no carotid bruit Cardiovascular- normal s1,s2, no murmurs, palpable dorsalis pedis and radial pulses, trace right leg edema Respiratory- bilateral clear to auscultation, no wheeze, no rhonchi, no crackles, no use of accessory muscles Abdomen- bowel sounds present, soft, non tender Musculoskeletal- able to move all 4 extremities, right hip ROM limited, on wheelchair Neurological- no focal deficit Skin- warm and dry, generalized erythematous macular rash on his back, covered in sweat, no open skin area on back, right hip surgical incision with steri strips, healing well, dry dressing over it Psychiatry- alert and oriented to person, place and time, normal mood and affect    Labs reviewed: Basic Metabolic Panel:  Recent Labs  05/39/76 0703 09/20/14 0539 09/21/14 0623  NA 138 132* 134*  K 3.6 3.9 4.3  CL 103 99* 100*  CO2 28 26 27   GLUCOSE 110* 140* 131*  BUN 12 9 15   CREATININE 1.17 0.88 0.86  CALCIUM 9.3 9.0 9.0   CBC:  Recent Labs  09/20/14 0539 09/21/14 0623 09/22/14 0357  WBC 12.2* 14.3* 11.7*  HGB 13.7 13.0 12.2*  HCT 40.4 38.2* 36.9*  MCV 86.0 86.0 87.6  PLT 276 292 303    Assessment/Plan  Right hip Osteoarthritis  S/P right total hip arthroplasty. Will have him work with physical therapy and occupational therapy team to help with gait training and muscle  strengthening exercises.fall precautions. Skin care. Encourage to be out of bed. Continue Xarelto 10 mg daily for DVT prophylaxis. Continue Dilaudid 2 mg q4h prn and oxycodone 15 mg q6h for breakthrough pain with oxycodone 15 mg q6h standing. Continue baclofen 10 mg q8h prn for muscle spasm. Has follow up with orthopedics  Opioid-induced constipation continue Movantik 25 mg daily, senna s 2 tab bid and miralax daily, hydration encouraged  Blood loss anemia Post op, monitor h&h, pending cbc from today  Macular rash On his back, itching present. Likely contact dermatitis with his excessive sweating and being on the bed mostly. Encouraged to be out of bed, pt to be cleaned ad his back area to be kept dry.  Apply triamcinolone ointment to the area after cleaning with water and drying the area. Also add benadryl 25 mg q8h prn for itching  Chronic pain  continue OxyContin 30 mg ER bid, follows up with pain clinic  Hypertension Stable. continue Norvasc 10 mg daily and lisinopril-HCTZ 20-12.5 mg daily  Hypothyroidism Continue synthroid 150 mcg daily  ADD continue Adderall 20 mg daily  Insomnia  continue Ambien 10 mg daily   Goals of care: short term rehabilitation   Labs/tests ordered: cbc  Family/ staff Communication: reviewed care plan with patient and nursing supervisor    Oneal Grout, MD  [facility] [phone] (Monday-Friday 8 am - 5 pm) [phone] (afterhours)

## 2014-09-29 ENCOUNTER — Non-Acute Institutional Stay (SKILLED_NURSING_FACILITY): Payer: 59 | Admitting: Adult Health

## 2014-09-29 DIAGNOSIS — G4700 Insomnia, unspecified: Secondary | ICD-10-CM | POA: Diagnosis not present

## 2014-09-29 DIAGNOSIS — F419 Anxiety disorder, unspecified: Secondary | ICD-10-CM | POA: Diagnosis not present

## 2014-09-29 DIAGNOSIS — I10 Essential (primary) hypertension: Secondary | ICD-10-CM | POA: Diagnosis not present

## 2014-09-29 DIAGNOSIS — K5909 Other constipation: Secondary | ICD-10-CM

## 2014-09-29 DIAGNOSIS — M1611 Unilateral primary osteoarthritis, right hip: Secondary | ICD-10-CM | POA: Diagnosis not present

## 2014-09-29 DIAGNOSIS — K5903 Drug induced constipation: Secondary | ICD-10-CM

## 2014-09-29 DIAGNOSIS — F909 Attention-deficit hyperactivity disorder, unspecified type: Secondary | ICD-10-CM | POA: Diagnosis not present

## 2014-09-29 DIAGNOSIS — T402X5A Adverse effect of other opioids, initial encounter: Secondary | ICD-10-CM | POA: Diagnosis not present

## 2014-09-29 DIAGNOSIS — E039 Hypothyroidism, unspecified: Secondary | ICD-10-CM

## 2014-09-29 DIAGNOSIS — E291 Testicular hypofunction: Secondary | ICD-10-CM

## 2014-09-29 DIAGNOSIS — G894 Chronic pain syndrome: Secondary | ICD-10-CM | POA: Diagnosis not present

## 2014-09-29 DIAGNOSIS — F988 Other specified behavioral and emotional disorders with onset usually occurring in childhood and adolescence: Secondary | ICD-10-CM

## 2014-09-29 DIAGNOSIS — R7989 Other specified abnormal findings of blood chemistry: Secondary | ICD-10-CM

## 2014-10-05 ENCOUNTER — Encounter: Payer: Self-pay | Admitting: Adult Health

## 2014-10-05 NOTE — Progress Notes (Signed)
Patient ID: Phillip Cook, male   DOB: [birth year], 59 y.o.   MRN: [id number]   09/29/14  Facility:  Nursing Home Location:  [facility] Room Number: 705-P LEVEL OF CARE:  SNF (31)   Chief Complaint  Patient presents with  . Discharge Note    Osteoarthritis S/P right total hip arthroplasty, hypertension, chronic pain, ADD, anxiety, opioid-induced constipation, insomnia and hypothyroidism    HISTORY OF PRESENT ILLNESS:  This is a 59 year old male who is for discharge home with Home health PT for endurance and OT for ADLs. DME: Rolling walker and bedside commode. [facility] has been [facility] 09/22/14 with osteoarthritis S/P right total hip arthroplasty. He has PMH of hypertension, hypothyroidism and arthritis.  Patient was admitted to this facility for short-term rehabilitation after the patient's recent hospitalization.  Patient has completed SNF rehabilitation and therapy has cleared the patient for discharge.  PAST MEDICAL HISTORY:  Past Medical History  Diagnosis Date  . Hypertension   . Hypothyroidism   . Arthritis   . Primary localized osteoarthritis of right hip 09/19/2014    CURRENT MEDICATIONS: Reviewed per MAR/see medication list  Allergies  Allergen Reactions  . Codeine Itching  . Other Other (See Comments)    Metal Nickel   Skin becomes red with itching and weeping       REVIEW OF SYSTEMS:  GENERAL: no change in appetite, no fatigue, no weight changes, no fever, chills or weakness RESPIRATORY: no cough, SOB, DOE, wheezing, hemoptysis CARDIAC: no chest pain, edema or palpitations GI: no abdominal pain, diarrhea, heart burn, nausea or vomiting, + constipation  PHYSICAL EXAMINATION  GENERAL: no acute distress, normal body habitus SKIN:  Right hip surgical wound has steri-strips, dry, no erythema NECK: supple, trachea midline, no neck masses, no thyroid tenderness, no thyromegaly LYMPHATICS: no LAN in the neck, no  supraclavicular LAN RESPIRATORY: breathing is even & unlabored, BS CTAB CARDIAC: RRR, no murmur,no extra heart sounds, no edema GI: abdomen soft, normal BS, no masses, no tenderness, no hepatomegaly, no splenomegaly EXTREMITIES:  Able to move 4 extremities PSYCHIATRIC: the patient is alert & oriented to person, affect & behavior appropriate  LABS/RADIOLOGY: Labs reviewed: 09/26/14  WBC 9.7 hemoglobin 18.0 hematocrit 38.9 MCV 85.5 sodium 138 potassium 5.2 glucose 100 BUN 18 creatinine 0.89 total bilirubin 0.4 alkaline phosphatase 225 SGOT 48 SGPT 120 total protein 5.7 ALT the mid 2.9 TSH 0.0 91 PSA 4.16 Basic Metabolic Panel:  Recent Labs  40/98/1106/14/16 0703 09/20/14 0539 09/21/14 0623  NA 138 132* 134*  K 3.6 3.9 4.3  CL 103 99* 100*  CO2 28 26 27   GLUCOSE 110* 140* 131*  BUN 12 9 15   CREATININE 1.17 0.88 0.86  CALCIUM 9.3 9.0 9.0    CBC:  Recent Labs  09/20/14 0539 09/21/14 0623 09/22/14 0357  WBC 12.2* 14.3* 11.7*  HGB 13.7 13.0 12.2*  HCT 40.4 38.2* 36.9*  MCV 86.0 86.0 87.6  PLT 276 292 303    Dg Pelvis Portable  09/19/2014   CLINICAL DATA:  Post hip replacement  EXAM: PORTABLE PELVIS 1-2 VIEWS  COMPARISON:  None.  FINDINGS: Staples and radiopaque possible soft tissue foreign bodies project over the perineal region. Right total hip arthroplasty is in place. No evidence for hardware failure or fracture.  IMPRESSION: Expected postoperative appearance after right total hip arthroplasty.   Electronically Signed   By: Christiana PellantGretchen  Green M.D.   On: 09/19/2014 11:05    ASSESSMENT/PLAN:  Osteoarthritis  S/P right total hip arthroplasty - for home health PT and OT; follow-up with Dr. Dion Saucier, orthopedic surgeon; continue Xarelto 10 mg 1 tab by mouth daily for DVT prophylaxis; baclofen 10 mg 3 times a day when necessary for muscle spasm and zanaflex and Valium were discontinued was discontinued; Dilaudid was discontinued and oxycodone 15 mg 1 tab by mouth every 6 hours routine and  oxycodone 15 mg 1 tab by mouth every 6 hours when necessary for pain Chronic pain - continue OxyContin 30 mg ER 1 tab by mouth every 12 hours Hypertension - continue Norvasc 10 mg 1 tab by mouth daily and lisinopril-HCTZ 20-12.5 mg 1 tab by mouth daily ADD - continue Adderall 20 mg by mouth daily Anxiety - mood is stable; continue Valium 5 mg 1 tab by mouth every 6 hours when necessary Opioid-induced constipation - continue Movantik 25 mg 1 tab by mouth daily, increase senna S to 2 tabs by mouth twice a day and MiraLAX 17 g by mouth daily Insomnia -  continue Ambien 10 mg 1 tab by mouth daily at bedtime Hypothyroidism - tsh 0.091; decreased Synthroid to 137 g by mouth daily and for tsh in 6 weeks Low testosterone - continue on AndroGel pump 20.25 mg/ACT gel 4 pumps on 2 skin daily    I have filled out patient's discharge paperwork and written prescriptions.  Patient will receive home health PT and OT.  DME provided:  Rolling walker and Bedside commode  Total discharge time: Greater than 30 minutes  Discharge time involved coordination of the discharge process with Child psychotherapist, nursing staff and therapy department. Medical justification for home health services/DME verified.  [facility], NP [employer] [phone]

## 2014-10-19 NOTE — Pre-Procedure Instructions (Signed)
Phillip HeysJames Cook  10/19/2014     Your procedure is scheduled on: Tuesday October 31, 2014 at 10:25 AM.  [facility]:25 [facility].M.  Call this number if you have problems the morning of surgery: [phone]    Remember:  Do not eat food or drink liquids after midnight.  Take these medicines the morning of surgery with A SIP OF WATER : Amlodipine (Norvasc), Baclofen (Lioresal) if needed, Levothyroxine (Synthroid), Oxycodone if needed, Oxycontin   Please follow your physicians instructions regarding Xarelto   Please stop taking any vitamins, herbal medications, Ibuprofen, Advil, Motrin, Aleve, etc on Tuesday July 19th   Do not wear jewelry.  Do not wear lotions, powders, or cologne.    Men may shave face and neck.  Do not bring valuables to the hospital.  [facility] not [facility] any belongings or valuables.  Contacts, dentures or bridgework may not be worn into surgery.  Leave your suitcase in the car.  After surgery it may be brought to your room.  For patients admitted to the hospital, discharge time will be determined by your treatment team.  Patients discharged the day of surgery will not be allowed to drive home.   Name and phone number of your driver:    Special instructions:  Please shower using CHG soap the night before and the morning of your surgery  Please read over the following fact sheets that you were given. Pain Booklet, Coughing and Deep Breathing, Total Joint Packet, MRSA Information and Surgical Site Infection Prevention

## 2014-10-20 ENCOUNTER — Encounter (HOSPITAL_COMMUNITY): Payer: Self-pay

## 2014-10-20 ENCOUNTER — Encounter (HOSPITAL_COMMUNITY)
Admission: RE | Admit: 2014-10-20 | Discharge: 2014-10-20 | Disposition: A | Discharge status: Home / Self Care | Payer: 59 | Source: Ambulatory Visit | Attending: Orthopedic Surgery | Admitting: Orthopedic Surgery

## 2014-10-20 DIAGNOSIS — M1611 Unilateral primary osteoarthritis, right hip: Secondary | ICD-10-CM | POA: Insufficient documentation

## 2014-10-20 DIAGNOSIS — Z01818 Encounter for other preprocedural examination: Secondary | ICD-10-CM | POA: Diagnosis present

## 2014-10-20 HISTORY — DX: Drug induced constipation: K59.03

## 2014-10-20 HISTORY — DX: Benign prostatic hyperplasia without lower urinary tract symptoms: N40.0

## 2014-10-20 LAB — CBC
HCT: 44.1 % (ref 39.0–52.0)
Hemoglobin: 14.7 g/dL (ref 13.0–17.0)
MCH: 28.7 pg (ref 26.0–34.0)
MCHC: 33.3 g/dL (ref 30.0–36.0)
MCV: 86.1 fL (ref 78.0–100.0)
Platelets: 248 10*3/uL (ref 150–400)
RBC: 5.12 MIL/uL (ref 4.22–5.81)
RDW: 13.9 % (ref 11.5–15.5)
WBC: 7.1 10*3/uL (ref 4.0–10.5)

## 2014-10-20 LAB — SURGICAL PCR SCREEN
MRSA, PCR: NEGATIVE
STAPHYLOCOCCUS AUREUS: NEGATIVE

## 2014-10-20 LAB — BASIC METABOLIC PANEL
Anion gap: 7 (ref 5–15)
BUN: 11 mg/dL (ref 6–20)
CO2: 26 mmol/L (ref 22–32)
CREATININE: 0.94 mg/dL (ref 0.61–1.24)
Calcium: 9.7 mg/dL (ref 8.9–10.3)
Chloride: 105 mmol/L (ref 101–111)
GFR calc Af Amer: >60 mL/min (ref >60)
Glucose, Bld: 95 mg/dL (ref 65–99)
POTASSIUM: 4.1 mmol/L (ref 3.5–5.1)
Sodium: 138 mmol/L (ref 135–145)

## 2014-10-20 NOTE — Progress Notes (Signed)
Patient denied having any acute cardiac or pulmonary issues. PCP is Marga MelnickWilliam Hopper.   When asked about Xarelto patient informed Nurse that he stopped taking it two weeks ago.

## 2014-10-31 ENCOUNTER — Inpatient Hospital Stay (HOSPITAL_COMMUNITY): Payer: 59

## 2014-10-31 ENCOUNTER — Inpatient Hospital Stay (HOSPITAL_COMMUNITY)
Admission: RE | Admit: 2014-10-31 | Discharge: 2014-11-03 | DRG: 470 | Disposition: A | Discharge status: Home Health | Payer: 59 | Source: Ambulatory Visit | Attending: Orthopedic Surgery | Admitting: Orthopedic Surgery

## 2014-10-31 ENCOUNTER — Encounter (HOSPITAL_COMMUNITY): Payer: Self-pay | Admitting: Anesthesiology

## 2014-10-31 ENCOUNTER — Inpatient Hospital Stay (HOSPITAL_COMMUNITY): Payer: 59 | Admitting: Certified Registered Nurse Anesthetist

## 2014-10-31 ENCOUNTER — Encounter (HOSPITAL_COMMUNITY)
Admission: RE | Disposition: A | Discharge status: Home Health | Payer: Self-pay | Source: Ambulatory Visit | Attending: Orthopedic Surgery

## 2014-10-31 DIAGNOSIS — K5903 Drug induced constipation: Secondary | ICD-10-CM | POA: Diagnosis present

## 2014-10-31 DIAGNOSIS — E039 Hypothyroidism, unspecified: Secondary | ICD-10-CM | POA: Diagnosis present

## 2014-10-31 DIAGNOSIS — T50995A Adverse effect of other drugs, medicaments and biological substances, initial encounter: Secondary | ICD-10-CM | POA: Diagnosis not present

## 2014-10-31 DIAGNOSIS — M87052 Idiopathic aseptic necrosis of left femur: Secondary | ICD-10-CM | POA: Diagnosis present

## 2014-10-31 DIAGNOSIS — N400 Enlarged prostate without lower urinary tract symptoms: Secondary | ICD-10-CM | POA: Diagnosis present

## 2014-10-31 DIAGNOSIS — Z79899 Other long term (current) drug therapy: Secondary | ICD-10-CM | POA: Diagnosis not present

## 2014-10-31 DIAGNOSIS — M1610 Unilateral primary osteoarthritis, unspecified hip: Secondary | ICD-10-CM | POA: Diagnosis present

## 2014-10-31 DIAGNOSIS — I10 Essential (primary) hypertension: Secondary | ICD-10-CM | POA: Diagnosis present

## 2014-10-31 DIAGNOSIS — M87852 Other osteonecrosis, left femur: Secondary | ICD-10-CM | POA: Diagnosis present

## 2014-10-31 DIAGNOSIS — K5909 Other constipation: Secondary | ICD-10-CM | POA: Diagnosis not present

## 2014-10-31 DIAGNOSIS — Z7902 Long term (current) use of antithrombotics/antiplatelets: Secondary | ICD-10-CM

## 2014-10-31 DIAGNOSIS — Z96641 Presence of right artificial hip joint: Secondary | ICD-10-CM | POA: Diagnosis present

## 2014-10-31 DIAGNOSIS — M879 Osteonecrosis, unspecified: Secondary | ICD-10-CM | POA: Diagnosis present

## 2014-10-31 HISTORY — DX: Idiopathic aseptic necrosis of left femur: M87.052

## 2014-10-31 HISTORY — PX: TOTAL HIP ARTHROPLASTY: SHX124

## 2014-10-31 SURGERY — ARTHROPLASTY, HIP, TOTAL,POSTERIOR APPROACH
Anesthesia: Spinal | Site: Hip | Laterality: Left

## 2014-10-31 MED ORDER — HYDROMORPHONE 0.3 MG/ML IV SOLN
INTRAVENOUS | Status: AC
  Administered 2014-10-31: 20:00:00
  Filled 2014-10-31: qty 25

## 2014-10-31 MED ORDER — POLYETHYLENE GLYCOL 3350 17 G PO PACK
17.0000 g | PACK | Freq: Every day | ORAL | Status: DC | PRN
  Administered 2014-11-03: 17 g via ORAL
  Filled 2014-10-31 (×2): qty 1

## 2014-10-31 MED ORDER — HYDROMORPHONE HCL 2 MG PO TABS: 2.0000 mg | ORAL_TABLET | ORAL | Status: DC | PRN

## 2014-10-31 MED ORDER — METOCLOPRAMIDE HCL 5 MG/ML IJ SOLN: 5.0000 mg | Freq: Three times a day (TID) | INTRAMUSCULAR | Status: DC | PRN

## 2014-10-31 MED ORDER — KETOROLAC TROMETHAMINE 15 MG/ML IJ SOLN
INTRAMUSCULAR | Status: AC
  Administered 2014-10-31: 18:00:00
  Filled 2014-10-31: qty 1

## 2014-10-31 MED ORDER — POTASSIUM CHLORIDE IN NACL 20-0.45 MEQ/L-% IV SOLN
INTRAVENOUS | Status: DC
  Administered 2014-10-31 – 2014-11-01 (×2): via INTRAVENOUS
  Filled 2014-10-31 (×8): qty 1000

## 2014-10-31 MED ORDER — DIPHENHYDRAMINE HCL 12.5 MG/5ML PO ELIX: 12.5000 mg | ORAL_SOLUTION | ORAL | Status: DC | PRN

## 2014-10-31 MED ORDER — METOCLOPRAMIDE HCL 5 MG PO TABS: 5.0000 mg | ORAL_TABLET | Freq: Three times a day (TID) | ORAL | Status: DC | PRN

## 2014-10-31 MED ORDER — ONDANSETRON HCL 4 MG/2ML IJ SOLN: 4.0000 mg | Freq: Once | INTRAMUSCULAR | Status: DC | PRN

## 2014-10-31 MED ORDER — METHOCARBAMOL 500 MG PO TABS: 500.0000 mg | ORAL_TABLET | Freq: Four times a day (QID) | ORAL | Status: DC | PRN

## 2014-10-31 MED ORDER — BACLOFEN 10 MG PO TABS
10.0000 mg | ORAL_TABLET | Freq: Three times a day (TID) | ORAL | Status: DC | PRN
  Administered 2014-11-02: 10 mg via ORAL
  Filled 2014-10-31 (×2): qty 1

## 2014-10-31 MED ORDER — ZOLPIDEM TARTRATE 5 MG PO TABS: 5.0000 mg | ORAL_TABLET | Freq: Every evening | ORAL | Status: DC | PRN

## 2014-10-31 MED ORDER — RIVAROXABAN 10 MG PO TABS: 10.0000 mg | ORAL_TABLET | Freq: Every day | ORAL | Status: DC

## 2014-10-31 MED ORDER — SODIUM CHLORIDE 0.9 % IJ SOLN: 9.0000 mL | INTRAMUSCULAR | Status: DC | PRN

## 2014-10-31 MED ORDER — ALUM & MAG HYDROXIDE-SIMETH 200-200-20 MG/5ML PO SUSP
30.0000 mL | ORAL | Status: DC | PRN
  Administered 2014-11-03: 30 mL via ORAL
  Filled 2014-10-31: qty 30

## 2014-10-31 MED ORDER — DIAZEPAM 5 MG PO TABS: 5.0000 mg | ORAL_TABLET | Freq: Four times a day (QID) | ORAL | Status: DC | PRN

## 2014-10-31 MED ORDER — MIDAZOLAM HCL 2 MG/2ML IJ SOLN
INTRAMUSCULAR | Status: AC
  Filled 2014-10-31: qty 2

## 2014-10-31 MED ORDER — HYDROMORPHONE 0.3 MG/ML IV SOLN
INTRAVENOUS | Status: DC
  Administered 2014-10-31: 0.3 mg via INTRAVENOUS
  Administered 2014-10-31: 5.7 mg via INTRAVENOUS
  Administered 2014-11-01: 1.8 mg via INTRAVENOUS
  Administered 2014-11-01: 0 mg via INTRAVENOUS
  Administered 2014-11-01: 3.6 mg via INTRAVENOUS
  Administered 2014-11-01: 1.5 mg via INTRAVENOUS
  Administered 2014-11-01: 2.1 mg via INTRAVENOUS
  Administered 2014-11-01: 3.15 mg via INTRAVENOUS
  Administered 2014-11-01: 3 mg via INTRAVENOUS
  Administered 2014-11-01 (×2): via INTRAVENOUS
  Administered 2014-11-02: 1.8 mg via INTRAVENOUS
  Filled 2014-10-31 (×3): qty 25

## 2014-10-31 MED ORDER — PROPOFOL 10 MG/ML IV BOLUS
INTRAVENOUS | Status: AC
  Filled 2014-10-31: qty 20

## 2014-10-31 MED ORDER — ACETAMINOPHEN 325 MG PO TABS: 650.0000 mg | ORAL_TABLET | Freq: Four times a day (QID) | ORAL | Status: DC | PRN

## 2014-10-31 MED ORDER — ZOLPIDEM TARTRATE 5 MG PO TABS
10.0000 mg | ORAL_TABLET | Freq: Every day | ORAL | Status: DC
  Administered 2014-10-31 – 2014-11-02 (×3): 10 mg via ORAL
  Filled 2014-10-31 (×3): qty 2

## 2014-10-31 MED ORDER — OXYCODONE HCL 5 MG PO TABS
15.0000 mg | ORAL_TABLET | ORAL | Status: DC
  Administered 2014-10-31 (×2): 20 mg via ORAL
  Administered 2014-11-01 (×5): 15 mg via ORAL
  Administered 2014-11-02 (×3): 30 mg via ORAL
  Administered 2014-11-02 (×2): 15 mg via ORAL
  Administered 2014-11-02 – 2014-11-03 (×2): 20 mg via ORAL
  Administered 2014-11-03: 30 mg via ORAL
  Filled 2014-10-31: qty 6
  Filled 2014-10-31: qty 3
  Filled 2014-10-31 (×2): qty 6
  Filled 2014-10-31 (×2): qty 4
  Filled 2014-10-31: qty 3
  Filled 2014-10-31: qty 4
  Filled 2014-10-31 (×2): qty 3
  Filled 2014-10-31 (×2): qty 4
  Filled 2014-10-31 (×3): qty 3
  Filled 2014-10-31: qty 6

## 2014-10-31 MED ORDER — OXYCODONE HCL 5 MG PO TABS: 15.0000 mg | ORAL_TABLET | Freq: Every day | ORAL | Status: DC

## 2014-10-31 MED ORDER — 0.9 % SODIUM CHLORIDE (POUR BTL) OPTIME
TOPICAL | Status: DC | PRN
  Administered 2014-10-31: 1000 mL

## 2014-10-31 MED ORDER — FENTANYL CITRATE (PF) 250 MCG/5ML IJ SOLN
INTRAMUSCULAR | Status: AC
  Filled 2014-10-31: qty 5

## 2014-10-31 MED ORDER — MENTHOL 3 MG MT LOZG: 1.0000 | LOZENGE | OROMUCOSAL | Status: DC | PRN

## 2014-10-31 MED ORDER — MIDAZOLAM HCL 5 MG/5ML IJ SOLN
INTRAMUSCULAR | Status: DC | PRN
  Administered 2014-10-31: 2 mg via INTRAVENOUS

## 2014-10-31 MED ORDER — BUPIVACAINE HCL (PF) 0.25 % IJ SOLN
INTRAMUSCULAR | Status: DC | PRN
  Administered 2014-10-31: 20 mL

## 2014-10-31 MED ORDER — TIZANIDINE HCL 4 MG PO TABS: 4.0000 mg | ORAL_TABLET | Freq: Three times a day (TID) | ORAL | Status: DC | PRN

## 2014-10-31 MED ORDER — PHENYLEPHRINE HCL 10 MG/ML IJ SOLN: 10.0000 mg | INTRAVENOUS | Status: DC | PRN

## 2014-10-31 MED ORDER — OXYCODONE HCL 5 MG PO TABS
5.0000 mg | ORAL_TABLET | ORAL | Status: DC | PRN
  Administered 2014-10-31: 10 mg via ORAL

## 2014-10-31 MED ORDER — MAGNESIUM CITRATE PO SOLN: 1.0000 | Freq: Once | ORAL | Status: AC | PRN

## 2014-10-31 MED ORDER — OXYCONTIN 30 MG PO T12A: 30.0000 mg | EXTENDED_RELEASE_TABLET | Freq: Two times a day (BID) | ORAL | Status: DC

## 2014-10-31 MED ORDER — HYDROCHLOROTHIAZIDE 12.5 MG PO CAPS
12.5000 mg | ORAL_CAPSULE | Freq: Every day | ORAL | Status: DC
  Administered 2014-11-01 – 2014-11-03 (×2): 12.5 mg via ORAL
  Filled 2014-10-31 (×3): qty 1

## 2014-10-31 MED ORDER — BISACODYL 10 MG RE SUPP
10.0000 mg | Freq: Every day | RECTAL | Status: DC | PRN
  Administered 2014-11-03: 10 mg via RECTAL
  Filled 2014-10-31 (×2): qty 1

## 2014-10-31 MED ORDER — LISINOPRIL 20 MG PO TABS
20.0000 mg | ORAL_TABLET | Freq: Every day | ORAL | Status: DC
  Administered 2014-10-31 – 2014-11-03 (×4): 20 mg via ORAL
  Filled 2014-10-31 (×4): qty 1

## 2014-10-31 MED ORDER — FENTANYL CITRATE (PF) 100 MCG/2ML IJ SOLN
INTRAMUSCULAR | Status: DC | PRN
  Administered 2014-10-31 (×5): 50 ug via INTRAVENOUS

## 2014-10-31 MED ORDER — PROPOFOL 10 MG/ML IV BOLUS
INTRAVENOUS | Status: DC | PRN
  Administered 2014-10-31: 20 mg via INTRAVENOUS

## 2014-10-31 MED ORDER — LACTATED RINGERS IV SOLN
INTRAVENOUS | Status: DC
  Administered 2014-10-31 (×2): via INTRAVENOUS

## 2014-10-31 MED ORDER — CEFAZOLIN SODIUM-DEXTROSE 2-3 GM-% IV SOLR
2.0000 g | INTRAVENOUS | Status: AC
  Administered 2014-10-31: 2 g via INTRAVENOUS
  Filled 2014-10-31: qty 50

## 2014-10-31 MED ORDER — CEFAZOLIN SODIUM-DEXTROSE 2-3 GM-% IV SOLR
2.0000 g | Freq: Four times a day (QID) | INTRAVENOUS | Status: AC
  Administered 2014-10-31: 2 g via INTRAVENOUS
  Filled 2014-10-31 (×3): qty 50

## 2014-10-31 MED ORDER — AMLODIPINE BESYLATE 10 MG PO TABS
10.0000 mg | ORAL_TABLET | Freq: Every day | ORAL | Status: DC
  Administered 2014-10-31 – 2014-11-03 (×4): 10 mg via ORAL
  Filled 2014-10-31 (×4): qty 1

## 2014-10-31 MED ORDER — SENNA 8.6 MG PO TABS
1.0000 | ORAL_TABLET | Freq: Two times a day (BID) | ORAL | Status: DC
  Administered 2014-10-31 – 2014-11-03 (×6): 8.6 mg via ORAL
  Filled 2014-10-31 (×7): qty 1

## 2014-10-31 MED ORDER — OXYCODONE HCL ER 30 MG PO T12A: 30.0000 mg | EXTENDED_RELEASE_TABLET | Freq: Two times a day (BID) | ORAL | Status: DC

## 2014-10-31 MED ORDER — PHENOL 1.4 % MT LIQD: 1.0000 | OROMUCOSAL | Status: DC | PRN

## 2014-10-31 MED ORDER — ACETAMINOPHEN 650 MG RE SUPP: 650.0000 mg | Freq: Four times a day (QID) | RECTAL | Status: DC | PRN

## 2014-10-31 MED ORDER — LISINOPRIL-HYDROCHLOROTHIAZIDE 20-12.5 MG PO TABS: 1.0000 | ORAL_TABLET | Freq: Every day | ORAL | Status: DC

## 2014-10-31 MED ORDER — LEVOTHYROXINE SODIUM 150 MCG PO TABS
150.0000 ug | ORAL_TABLET | Freq: Two times a day (BID) | ORAL | Status: DC
  Administered 2014-10-31 – 2014-11-03 (×6): 150 ug via ORAL
  Filled 2014-10-31 (×6): qty 1

## 2014-10-31 MED ORDER — METHOCARBAMOL 1000 MG/10ML IJ SOLN
500.0000 mg | Freq: Four times a day (QID) | INTRAVENOUS | Status: DC | PRN
  Filled 2014-10-31: qty 5

## 2014-10-31 MED ORDER — PROPOFOL INFUSION 10 MG/ML OPTIME
INTRAVENOUS | Status: DC | PRN
  Administered 2014-10-31: 50 ug/kg/min via INTRAVENOUS
  Administered 2014-10-31: 12:00:00 via INTRAVENOUS

## 2014-10-31 MED ORDER — BACLOFEN 10 MG PO TABS: 10.0000 mg | ORAL_TABLET | Freq: Three times a day (TID) | ORAL | Status: DC

## 2014-10-31 MED ORDER — OXYCODONE HCL 5 MG PO TABS
ORAL_TABLET | ORAL | Status: AC
  Filled 2014-10-31: qty 2

## 2014-10-31 MED ORDER — OXYCODONE HCL 15 MG PO TABS: 15.0000 mg | ORAL_TABLET | Freq: Every day | ORAL | Status: DC

## 2014-10-31 MED ORDER — ONDANSETRON HCL 4 MG/2ML IJ SOLN: 4.0000 mg | Freq: Four times a day (QID) | INTRAMUSCULAR | Status: DC | PRN

## 2014-10-31 MED ORDER — NALOXONE HCL 0.4 MG/ML IJ SOLN: 0.4000 mg | INTRAMUSCULAR | Status: DC | PRN

## 2014-10-31 MED ORDER — AMPHETAMINE-DEXTROAMPHETAMINE 10 MG PO TABS
20.0000 mg | ORAL_TABLET | Freq: Every day | ORAL | Status: DC
  Administered 2014-11-01 – 2014-11-03 (×3): 20 mg via ORAL
  Filled 2014-10-31 (×3): qty 2

## 2014-10-31 MED ORDER — KETOROLAC TROMETHAMINE 15 MG/ML IJ SOLN
7.5000 mg | Freq: Four times a day (QID) | INTRAMUSCULAR | Status: AC
Start: 2014-10-31 — End: 2014-11-01
  Administered 2014-10-31 – 2014-11-01 (×3): 7.5 mg via INTRAVENOUS
  Filled 2014-10-31 (×3): qty 1

## 2014-10-31 MED ORDER — DOCUSATE SODIUM 100 MG PO CAPS
100.0000 mg | ORAL_CAPSULE | Freq: Two times a day (BID) | ORAL | Status: DC
  Administered 2014-10-31 – 2014-11-03 (×6): 100 mg via ORAL
  Filled 2014-10-31 (×7): qty 1

## 2014-10-31 MED ORDER — PHENYLEPHRINE HCL 10 MG/ML IJ SOLN
INTRAMUSCULAR | Status: DC | PRN
  Administered 2014-10-31: 80 ug via INTRAVENOUS
  Administered 2014-10-31 (×2): 40 ug via INTRAVENOUS
  Administered 2014-10-31: 80 ug via INTRAVENOUS

## 2014-10-31 MED ORDER — RIVAROXABAN 10 MG PO TABS
10.0000 mg | ORAL_TABLET | Freq: Every day | ORAL | Status: DC
  Administered 2014-11-01 – 2014-11-03 (×3): 10 mg via ORAL
  Filled 2014-10-31 (×3): qty 1

## 2014-10-31 MED ORDER — HYDROMORPHONE HCL 2 MG PO TABS
2.0000 mg | ORAL_TABLET | ORAL | Status: DC | PRN
  Administered 2014-11-02 (×2): 2 mg via ORAL
  Filled 2014-10-31 (×2): qty 1

## 2014-10-31 MED ORDER — DEXAMETHASONE SODIUM PHOSPHATE 10 MG/ML IJ SOLN
10.0000 mg | Freq: Once | INTRAMUSCULAR | Status: AC
  Administered 2014-11-01: 10 mg via INTRAVENOUS
  Filled 2014-10-31: qty 1

## 2014-10-31 MED ORDER — OXYCODONE HCL ER 15 MG PO T12A
30.0000 mg | EXTENDED_RELEASE_TABLET | Freq: Two times a day (BID) | ORAL | Status: DC
  Administered 2014-10-31 – 2014-11-03 (×6): 30 mg via ORAL
  Filled 2014-10-31 (×7): qty 2

## 2014-10-31 SURGICAL SUPPLY — 61 items
BLADE SAW SAG 73X25 THK (BLADE) ×2
BLADE SAW SGTL 73X25 THK (BLADE) ×1 IMPLANT
BRUSH FEMORAL CANAL (MISCELLANEOUS) IMPLANT
CAPT HIP TOTAL 2 ×3 IMPLANT
CLOSURE STERI-STRIP 1/2X4 (GAUZE/BANDAGES/DRESSINGS) ×2
CLSR STERI-STRIP ANTIMIC 1/2X4 (GAUZE/BANDAGES/DRESSINGS) ×4 IMPLANT
COVER SURGICAL LIGHT HANDLE (MISCELLANEOUS) ×3 IMPLANT
DRAPE IMP U-DRAPE 54X76 (DRAPES) ×3 IMPLANT
DRAPE INCISE IOBAN 66X45 STRL (DRAPES) IMPLANT
DRAPE ORTHO SPLIT 77X108 STRL (DRAPES) ×4
DRAPE PROXIMA HALF (DRAPES) ×6 IMPLANT
DRAPE SURG ORHT 6 SPLT 77X108 (DRAPES) ×2 IMPLANT
DRAPE U-SHAPE 47X51 STRL (DRAPES) ×3 IMPLANT
DRILL BIT 5/64 (BIT) ×3 IMPLANT
DRSG MEPILEX BORDER 4X12 (GAUZE/BANDAGES/DRESSINGS) ×3 IMPLANT
DRSG MEPILEX BORDER 4X8 (GAUZE/BANDAGES/DRESSINGS) IMPLANT
DRSG PAD ABDOMINAL 8X10 ST (GAUZE/BANDAGES/DRESSINGS) IMPLANT
DURAPREP 26ML APPLICATOR (WOUND CARE) ×3 IMPLANT
ELECT CAUTERY BLADE 6.4 (BLADE) ×3 IMPLANT
ELECT REM PT RETURN 9FT ADLT (ELECTROSURGICAL) ×3
ELECTRODE REM PT RTRN 9FT ADLT (ELECTROSURGICAL) ×1 IMPLANT
GLOVE BIOGEL PI IND STRL 8 (GLOVE) ×1 IMPLANT
GLOVE BIOGEL PI INDICATOR 8 (GLOVE) ×2
GLOVE BIOGEL PI ORTHO PRO SZ8 (GLOVE) ×2
GLOVE ORTHO TXT STRL SZ7.5 (GLOVE) ×3 IMPLANT
GLOVE PI ORTHO PRO STRL SZ8 (GLOVE) ×1 IMPLANT
GLOVE SURG ORTHO 8.0 STRL STRW (GLOVE) ×3 IMPLANT
GOWN STRL REUS W/ TWL XL LVL3 (GOWN DISPOSABLE) ×1 IMPLANT
GOWN STRL REUS W/TWL 2XL LVL3 (GOWN DISPOSABLE) ×3 IMPLANT
GOWN STRL REUS W/TWL XL LVL3 (GOWN DISPOSABLE) ×2
HANDPIECE INTERPULSE COAX TIP (DISPOSABLE)
HOOD PEEL AWAY FACE SHEILD DIS (HOOD) ×6 IMPLANT
KIT BASIN OR (CUSTOM PROCEDURE TRAY) ×3 IMPLANT
KIT ROOM TURNOVER OR (KITS) ×3 IMPLANT
MANIFOLD NEPTUNE II (INSTRUMENTS) ×3 IMPLANT
NDL SUT .5 MAYO 1.404X.05X (NEEDLE) ×1 IMPLANT
NEEDLE HYPO 25GX1X1/2 BEV (NEEDLE) ×3 IMPLANT
NEEDLE MAYO TAPER (NEEDLE) ×2
NS IRRIG 1000ML POUR BTL (IV SOLUTION) ×3 IMPLANT
PACK TOTAL JOINT (CUSTOM PROCEDURE TRAY) ×3 IMPLANT
PAD ARMBOARD 7.5X6 YLW CONV (MISCELLANEOUS) ×6 IMPLANT
PILLOW ABDUCTION HIP (SOFTGOODS) ×3 IMPLANT
PRESSURIZER FEMORAL UNIV (MISCELLANEOUS) IMPLANT
RETRIEVER SUT HEWSON (MISCELLANEOUS) ×3 IMPLANT
SET HNDPC FAN SPRY TIP SCT (DISPOSABLE) IMPLANT
SPONGE LAP 4X18 X RAY DECT (DISPOSABLE) IMPLANT
SUCTION FRAZIER TIP 10 FR DISP (SUCTIONS) ×3 IMPLANT
SUT FIBERWIRE #2 38 REV NDL BL (SUTURE) ×9
SUT MNCRL AB 4-0 PS2 18 (SUTURE) ×3 IMPLANT
SUT VIC AB 0 CT1 27 (SUTURE) ×2
SUT VIC AB 0 CT1 27XBRD ANBCTR (SUTURE) ×1 IMPLANT
SUT VIC AB 2-0 CT1 27 (SUTURE) ×2
SUT VIC AB 2-0 CT1 TAPERPNT 27 (SUTURE) ×1 IMPLANT
SUT VIC AB 3-0 SH 8-18 (SUTURE) ×3 IMPLANT
SUTURE FIBERWR#2 38 REV NDL BL (SUTURE) ×3 IMPLANT
SYR CONTROL 10ML LL (SYRINGE) ×3 IMPLANT
TOWEL OR 17X24 6PK STRL BLUE (TOWEL DISPOSABLE) ×3 IMPLANT
TOWEL OR 17X26 10 PK STRL BLUE (TOWEL DISPOSABLE) ×3 IMPLANT
TOWER CARTRIDGE SMART MIX (DISPOSABLE) IMPLANT
TRAY FOLEY CATH 14FR (SET/KITS/TRAYS/PACK) ×3 IMPLANT
WATER STERILE IRR 1000ML POUR (IV SOLUTION) ×6 IMPLANT

## 2014-10-31 NOTE — H&P (Signed)
PREOPERATIVE H&P  Chief Complaint: Avascular necrosis left hip  HPI: Phillip Cook is a 59 y.o. male who presents for preoperative history and physical with a diagnosis of avascular necrosis left hip. Symptoms are rated as moderate to severe, and have been worsening.  This is significantly impairing activities of daily living.  He has elected for surgical management.   He has failed injections, activity modification, anti-inflammatories, and assistive devices.  Preoperative X-rays demonstrate end stage degenerative changes with osteophyte formation, loss of joint space, subchondral sclerosis. He also has collapse of the femoral head  He had a right total hip replacement for the same problem 09/29/2014.   Past Medical History  Diagnosis Date  . Hypertension   . Hypothyroidism   . Arthritis   . Primary localized osteoarthritis of right hip 09/19/2014  . Enlarged prostate   . Constipation due to pain medication    Past Surgical History  Procedure Laterality Date  . Tonsillectomy    . Arthroscopic  knee Left   . Total hip arthroplasty Right 09/19/2014  . Colonoscopy    . Total hip arthroplasty Right 09/19/2014    Procedure: TOTAL HIP ARTHROPLASTY;  Surgeon: Teryl Lucy, MD;  Location: [facility] OR;  Service: Orthopedics;  Laterality: Right;   History   Social History  . Marital Status: Legally Separated    Spouse Name: N/A  . Number of Children: N/A  . Years of Education: N/A   Social History Main Topics  . Smoking status: Never Smoker   . Smokeless tobacco: Never Used  . Alcohol Use: 1.2 oz/week    1 Glasses of wine, 1 Cans of beer per week  . Drug Use: Yes    Special: Marijuana     Comment: many years ago  . Sexual Activity: Not on file   Other Topics Concern  . Not on file   Social History Narrative   No family history on file. Allergies  Allergen Reactions  . Codeine Itching  . Other Other (See Comments)    Metal Nickel   Skin becomes red with itching and weeping      Prior to Admission medications   Medication Sig Start Date End Date Taking? Authorizing Provider  amLODipine (NORVASC) 5 MG tablet Take 10 mg by mouth daily. 08/24/14  Yes Historical Provider, MD  amphetamine-dextroamphetamine (ADDERALL) 20 MG tablet Take 20 mg by mouth daily. 08/28/14  Yes Historical Provider, MD  ANDROGEL PUMP 20.25 MG/ACT (1.62%) GEL Apply 4 pumps onto skin daily 08/28/14  Yes Historical Provider, MD  baclofen (LIORESAL) 10 MG tablet Take 1 tablet (10 mg total) by mouth 3 (three) times daily. As needed for muscle spasm 09/19/14  Yes Teryl Lucy, MD  diazepam (VALIUM) 5 MG tablet Take 1 tablet (5 mg total) by mouth every 6 (six) hours as needed for anxiety. 09/19/14  Yes Teryl Lucy, MD  HYDROmorphone (DILAUDID) 2 MG tablet Take 1 tablet (2 mg total) by mouth every 4 (four) hours as needed for severe pain. 09/19/14  Yes Teryl Lucy, MD  levothyroxine (SYNTHROID, LEVOTHROID) 75 MCG tablet Take 150 mcg by mouth 2 (two) times daily. 08/14/14  Yes Historical Provider, MD  lisinopril-hydrochlorothiazide (PRINZIDE,ZESTORETIC) 20-12.5 MG per tablet Take 1 tablet by mouth daily. 08/14/14  Yes Historical Provider, MD  MOVANTIK 25 MG TABS tablet Take 25 mg by mouth every morning. 08/18/14  Yes Historical Provider, MD  ondansetron (ZOFRAN) 4 MG tablet Take 1 tablet (4 mg total) by mouth every 8 (eight) hours as needed for  nausea or vomiting. 09/19/14  Yes Teryl Lucy, MD  OVER THE COUNTER MEDICATION Take 1 tablet by mouth as needed (constipation). OTC laxative   Yes Historical Provider, MD  oxyCODONE (ROXICODONE) 15 MG immediate release tablet Take 1-2 tablets (15-30 mg total) by mouth 6 (six) times daily. 09/19/14  Yes Teryl Lucy, MD  OXYCONTIN 30 MG T12A Take 30 mg by mouth 2 (two) times daily. 09/19/14  Yes Teryl Lucy, MD  rivaroxaban (XARELTO) 10 MG TABS tablet Take 1 tablet (10 mg total) by mouth daily. 09/19/14  Yes Teryl Lucy, MD  sennosides-docusate sodium (SENOKOT-S) 8.6-50 MG  tablet Take 2 tablets by mouth daily. Patient taking differently: Take 2 tablets by mouth daily as needed.  09/19/14  Yes Teryl Lucy, MD  tiZANidine (ZANAFLEX) 4 MG tablet Take 4 mg by mouth every 8 (eight) hours as needed for muscle spasms.  08/24/14  Yes Historical Provider, MD  zolpidem (AMBIEN) 10 MG tablet Take 10 mg by mouth at bedtime. 08/28/14  Yes Historical Provider, MD     Positive ROS: All other systems have been reviewed and were otherwise negative with the exception of those mentioned in the HPI and as above.  Physical Exam: General: Alert, no acute distress Cardiovascular: No pedal edema Respiratory: No cyanosis, no use of accessory musculature GI: No organomegaly, abdomen is soft and non-tender Skin: No lesions in the area of chief complaint Neurologic: Sensation intact distally Psychiatric: Patient is competent for consent with normal mood and affect Lymphatic: No axillary or cervical lymphadenopathy  MUSCULOSKELETAL: Left hip has severely limited range of motion, 0-80 at most, minimal internal and external rotation EHL and FHL are intact.  Assessment: Avascular necrosis left hip  Plan: Plan for Procedure(s): TOTAL HIP ARTHROPLASTY  The risks benefits and alternatives were discussed with the patient including but not limited to the risks of nonoperative treatment, versus surgical intervention including infection, bleeding, nerve injury, periprosthetic fracture, the need for revision surgery, dislocation, leg length discrepancy, blood clots, cardiopulmonary complications, morbidity, mortality, among others, and they were willing to proceed.     Eulas Post, MD Cell [phone]   10/31/2014 6:17 AM

## 2014-10-31 NOTE — Transfer of Care (Signed)
Immediate Anesthesia Transfer of Care Note  Patient: Phillip Cook  Procedure(s) Performed: Procedure(s): TOTAL HIP ARTHROPLASTY (Left)  Patient Location: PACU  Anesthesia Type:MAC and Spinal  Level of Consciousness: awake, alert  and oriented  Airway & Oxygen Therapy: Patient Spontanous Breathing  Post-op Assessment: Report given to RN and Post -op Vital signs reviewed and stable  Post vital signs: Reviewed and stable  Last Vitals:  Filed Vitals:   10/31/14 0901  BP: 140/89  Pulse: 72  Temp: 36.8 C  Resp: 20    Complications: No apparent anesthesia complications

## 2014-10-31 NOTE — Anesthesia Postprocedure Evaluation (Signed)
  Anesthesia Post-op Note  Patient: Phillip Cook  Procedure(s) Performed: Procedure(s): TOTAL HIP ARTHROPLASTY (Left)  Patient Location: PACU  Anesthesia Type:Spinal  Level of Consciousness: awake, alert , oriented and patient cooperative  Airway and Oxygen Therapy: Patient Spontanous Breathing  Post-op Pain: none  Post-op Assessment: Post-op Vital signs reviewed, Patient's Cardiovascular Status Stable, Respiratory Function Stable, Patent Airway, No signs of Nausea or vomiting, Pain level controlled and Spinal receding LLE Motor Response: No movement due to regional block   RLE Motor Response: No movement due to regional block   L Sensory Level: L1-Inguinal (groin) region R Sensory Level: L1-Inguinal (groin) region  Post-op Vital Signs: stable  Last Vitals:  Filed Vitals:   10/31/14 1325  BP:   Pulse:   Temp:   Resp: 13    Complications: No apparent anesthesia complications

## 2014-10-31 NOTE — Op Note (Signed)
10/31/2014  12:56 PM  PATIENT:  Phillip Cook   MRN: [id number]  PRE-OPERATIVE DIAGNOSIS:  left hip avascular necrosis  POST-OPERATIVE DIAGNOSIS:  left hip avascular necrosis  PROCEDURE:  Procedure(s): TOTAL HIP ARTHROPLASTY  PREOPERATIVE INDICATIONS:    Rey Dansby is an 59 y.o. male who has a diagnosis of Avascular necrosis of bone of left hip and elected for surgical management after failing conservative treatment.  The risks benefits and alternatives were discussed with the patient including but not limited to the risks of nonoperative treatment, versus surgical intervention including infection, bleeding, nerve injury, periprosthetic fracture, the need for revision surgery, dislocation, leg length discrepancy, blood clots, cardiopulmonary complications, morbidity, mortality, among others, and they were willing to proceed.     OPERATIVE REPORT     SURGEON:  Marchia Bond, MD    ASSISTANT:  Joya Gaskins, OPA-C  (Present throughout the entire procedure,  necessary for completion of procedure in a timely manner, assisting with retraction, instrumentation, and closure)     ANESTHESIA:  General    COMPLICATIONS:  None.     COMPONENTS:  [employer] fit femur size 6 with a 36 mm +5 ceramic head ball and a gription acetabular shell size 58 with +4 neutroal polyethylene liner    Unique aspects of the case: His head had advanced avascular necrosis with collapse but the acetabulum still had appropriate shape, in contrast to his contralateral side. He was very short however. I made my neck cut slightly higher than I did on the contralateral side, and I had complete fill of the canal with the size 6. The +5 restored length better. He was completely stable with this construct, and he was even stable with the +1.5 but I felt the +5 restored length better. The acetabular screw I used for back up because I adjusted the position of the cup because I was slightly over anteverted to begin with,  and so I removed the cup during insertion and replaced it, and therefore used a screw for back up. The screw I believe was aiming slightly posterior, and missed the central portion of the column, so I only used a 15 screw in order to just get bony purchase but not risk injury to the sciatic nerve.  PROCEDURE IN DETAIL:   The patient was met in the holding area and  identified.  The appropriate hip was identified and marked at the operative site.  The patient was then transported to the OR  and  placed under general anesthesia.  At that point, the patient was  placed in the lateral decubitus position with the operative side up and  secured to the operating room table and all bony prominences padded.     The operative lower extremity was prepped from the iliac crest to the distal leg.  Sterile draping was performed.  Time out was performed prior to incision.      A routine posterolateral approach was utilized via sharp dissection  carried down to the subcutaneous tissue.  Gross bleeders were Bovie coagulated.  The iliotibial band was identified and incised along the length of the skin incision.  Self-retaining retractors were  inserted.  With the hip internally rotated, the short external rotators  were identified. The piriformis and capsule was tagged with FiberWire, and the hip capsule released in a T-type fashion.  The femoral neck was exposed, and I resected the femoral neck using the appropriate jig. This was performed at approximately a thumb's breadth above the lesser trochanter.  I then exposed the deep acetabulum, cleared out any tissue including the ligamentum teres.  A wing retractor was placed.  After adequate visualization, I excised the labrum, and then sequentially reamed.  I placed the trial acetabulum, which seated nicely, and then impacted the real cup into place.  Appropriate version and inclination was confirmed clinically matching their bony anatomy, and also with the use of the jig.  Initially, I was slightly anteverted, and so I removed the cup and replaced back into an appropriate position.  A neutral trial polyethylene liner was placed and the wing retractor removed.    I then prepared the proximal femur using the cookie-cutter, the lateralizing reamer, and then sequentially reamed and broached.  A trial broach, neck, and head was utilized, and I reduced the hip and it was found to have excellent stability with functional range of motion. The trial components were then removed, and the real polyethylene liner was placed. The +5 restored leg length better than 1.5.  I then impacted the real femoral prosthesis into place into the appropriate version, slightly anteverted to the normal anatomy, and I impacted the real head ball into place. The hip was then reduced and taken through functional range of motion and found to have excellent stability. Leg lengths were restored.  I then used a 2 mm drill bits to pass the FiberWire suture from the capsule and piriformis through the greater trochanter, and secured this. Satisfactory posterior capsular repair was achieved, he may have been slightly longer based on soft tissue tension, but I suspect some of this was from contracture from the collapsing hip.. I also closed the T in the capsule.  I then irrigated the hip copiously again with pulse lavage, and repaired the fascia with Vicryl, followed by Vicryl for the subcutaneous tissue, Monocryl for the skin, Steri-Strips and sterile gauze. The wounds were injected. The patient was then awakened and returned to PACU in stable and satisfactory condition. There were no complications.  Marchia Bond, MD Orthopedic Surgeon [phone]   10/31/2014 12:56 PM

## 2014-10-31 NOTE — Anesthesia Preprocedure Evaluation (Signed)
Anesthesia Evaluation  Patient identified by MRN, date of birth, ID band Patient awake    Reviewed: Allergy & Precautions, NPO status , Patient's Chart, lab work & pertinent test results  Airway Mallampati: I       Dental   Pulmonary    Pulmonary exam normal       Cardiovascular hypertension, Normal cardiovascular exam    Neuro/Psych    GI/Hepatic   Endo/Other  Hypothyroidism   Renal/GU      Musculoskeletal  (+) Arthritis -,   Abdominal   Peds  Hematology   Anesthesia Other Findings   Reproductive/Obstetrics                             Anesthesia Physical Anesthesia Plan  ASA: I  Anesthesia Plan: Spinal   Post-op Pain Management:    Induction: Intravenous  Airway Management Planned: Mask  Additional Equipment:   Intra-op Plan:   Post-operative Plan:   Informed Consent: I have reviewed the patients History and Physical, chart, labs and discussed the procedure including the risks, benefits and alternatives for the proposed anesthesia with the patient or authorized representative who has indicated his/her understanding and acceptance.     Plan Discussed with: CRNA, Anesthesiologist and Surgeon  Anesthesia Plan Comments:         Anesthesia Quick Evaluation

## 2014-10-31 NOTE — Care Management Utilization Note (Signed)
Utilization review completed. Jasai Sorg, RN Case Manager [phone]. 

## 2014-10-31 NOTE — Progress Notes (Signed)
Orthopedic Tech Progress Note Patient Details:  Phillip Cook 29-Oct-1955 [id number] Applied OHF with trapeze to pt.'s bed. Patient ID: Phillip Cook, male   DOB: Apr 16, 1955, 59 y.o.   MRN: [id number]   Lesle Chris 10/31/2014, 10:02 PM

## 2014-10-31 NOTE — Anesthesia Procedure Notes (Signed)
Spinal Patient location during procedure: OR Start time: 10/31/2014 10:33 AM End time: 10/31/2014 10:37 AM Staffing Anesthesiologist: Laverle Hobby Performed by: anesthesiologist  Preanesthetic Checklist Completed: patient identified, site marked, surgical consent, pre-op evaluation, timeout performed, IV checked, risks and benefits discussed and monitors and equipment checked Spinal Block Patient position: sitting Prep: ChloraPrep Patient monitoring: heart rate, cardiac monitor, continuous pulse ox and blood pressure Approach: midline Location: L2-3 Injection technique: single-shot Needle Needle type: Whitacre  Needle gauge: 25 G Needle length: 9 cm Needle insertion depth: 5 cm Assessment Sensory level: T10 Additional Notes Pt accepts procedure w/ risks. 10 mg Marcaine w/ epi w/o difficulty. CSF clear free flow w/o blood. GES

## 2014-11-01 ENCOUNTER — Encounter (HOSPITAL_COMMUNITY): Payer: Self-pay | Admitting: Orthopedic Surgery

## 2014-11-01 LAB — BASIC METABOLIC PANEL
Anion gap: 6 (ref 5–15)
BUN: 12 mg/dL (ref 6–20)
CALCIUM: 8.6 mg/dL — AB (ref 8.9–10.3)
CHLORIDE: 97 mmol/L — AB (ref 101–111)
CO2: 27 mmol/L (ref 22–32)
Creatinine, Ser: 1.11 mg/dL (ref 0.61–1.24)
GFR calc Af Amer: >60 mL/min (ref >60)
GFR calc non Af Amer: >60 mL/min (ref >60)
GLUCOSE: 104 mg/dL — AB (ref 65–99)
POTASSIUM: 4.1 mmol/L (ref 3.5–5.1)
Sodium: 130 mmol/L — ABNORMAL LOW (ref 135–145)

## 2014-11-01 LAB — CBC
HCT: 39.9 % (ref 39.0–52.0)
Hemoglobin: 13.4 g/dL (ref 13.0–17.0)
MCH: 29.2 pg (ref 26.0–34.0)
MCHC: 33.6 g/dL (ref 30.0–36.0)
MCV: 86.9 fL (ref 78.0–100.0)
PLATELETS: 273 10*3/uL (ref 150–400)
RBC: 4.59 MIL/uL (ref 4.22–5.81)
RDW: 14.4 % (ref 11.5–15.5)
WBC: 10.3 10*3/uL (ref 4.0–10.5)

## 2014-11-01 NOTE — Discharge Instructions (Addendum)
INSTRUCTIONS AFTER JOINT REPLACEMENT  ° °o Remove items at home which could result in a fall. This includes throw rugs or furniture in walking pathways °o ICE to the affected joint every three hours while awake for 30 minutes at a time, for at least the first 3-5 days, and then as needed for pain and swelling.  Continue to use ice for pain and swelling. You may notice swelling that will progress down to the foot and ankle.  This is normal after surgery.  Elevate your leg when you are not up walking on it.   °o Continue to use the breathing machine you got in the hospital (incentive spirometer) which will help keep your temperature down.  It is common for your temperature to cycle up and down following surgery, especially at night when you are not up moving around and exerting yourself.  The breathing machine keeps your lungs expanded and your temperature down. ° ° °DIET:  As you were doing prior to hospitalization, we recommend a well-balanced diet. ° °DRESSING / WOUND CARE / SHOWERING ° °You may change your dressing 3-5 days after surgery.  Then change the dressing every day with sterile gauze.  Please use good hand washing techniques before changing the dressing.  Do not use any lotions or creams on the incision until instructed by your surgeon. ° °ACTIVITY ° °o Increase activity slowly as tolerated, but follow the weight bearing instructions below.   °o No driving for 6 weeks or until further direction given by your physician.  You cannot drive while taking narcotics.  °o No lifting or carrying greater than 10 lbs. until further directed by your surgeon. °o Avoid periods of inactivity such as sitting longer than an hour when not asleep. This helps prevent blood clots.  °o You may return to work once you are authorized by your doctor.  ° ° ° °WEIGHT BEARING  ° °Weight bearing as tolerated with assist device (walker, cane, etc) as directed, use it as long as suggested by your surgeon or therapist, typically at  least 4-6 weeks. ° ° °EXERCISES ° °Results after joint replacement surgery are often greatly improved when you follow the exercise, range of motion and muscle strengthening exercises prescribed by your doctor. Safety measures are also important to protect the joint from further injury. Any time any of these exercises cause you to have increased pain or swelling, decrease what you are doing until you are comfortable again and then slowly increase them. If you have problems or questions, call your caregiver or physical therapist for advice.  ° °Rehabilitation is important following a joint replacement. After just a few days of immobilization, the muscles of the leg can become weakened and shrink (atrophy).  These exercises are designed to build up the tone and strength of the thigh and leg muscles and to improve motion. Often times heat used for twenty to thirty minutes before working out will loosen up your tissues and help with improving the range of motion but do not use heat for the first two weeks following surgery (sometimes heat can increase post-operative swelling).  ° °These exercises can be done on a training (exercise) mat, on the floor, on a table or on a bed. Use whatever works the best and is most comfortable for you.    Use music or television while you are exercising so that the exercises are a pleasant break in your day. This will make your life better with the exercises acting as a break   in your routine that you can look forward to.   Perform all exercises about fifteen times, three times per day or as directed.  You should exercise both the operative leg and the other leg as well. ° °Exercises include: °  °• Quad Sets - Tighten up the muscle on the front of the thigh (Quad) and hold for 5-10 seconds.   °• Straight Leg Raises - With your knee straight (if you were given a brace, keep it on), lift the leg to 60 degrees, hold for 3 seconds, and slowly lower the leg.  Perform this exercise against  resistance later as your leg gets stronger.  °• Leg Slides: Lying on your back, slowly slide your foot toward your buttocks, bending your knee up off the floor (only go as far as is comfortable). Then slowly slide your foot back down until your leg is flat on the floor again.  °• Angel Wings: Lying on your back spread your legs to the side as far apart as you can without causing discomfort.  °• Hamstring Strength:  Lying on your back, push your heel against the floor with your leg straight by tightening up the muscles of your buttocks.  Repeat, but this time bend your knee to a comfortable angle, and push your heel against the floor.  You may put a pillow under the heel to make it more comfortable if necessary.  ° °A rehabilitation program following joint replacement surgery can speed recovery and prevent re-injury in the future due to weakened muscles. Contact your doctor or a physical therapist for more information on knee rehabilitation.  ° ° °CONSTIPATION ° °Constipation is defined medically as fewer than three stools per week and severe constipation as less than one stool per week.  Even if you have a regular bowel pattern at home, your normal regimen is likely to be disrupted due to multiple reasons following surgery.  Combination of anesthesia, postoperative narcotics, change in appetite and fluid intake all can affect your bowels.  ° °YOU MUST use at least one of the following options; they are listed in order of increasing strength to get the job done.  They are all available over the counter, and you may need to use some, POSSIBLY even all of these options:   ° °Drink plenty of fluids (prune juice may be helpful) and high fiber foods °Colace 100 mg by mouth twice a day  °Senokot for constipation as directed and as needed Dulcolax (bisacodyl), take with full glass of water  °Miralax (polyethylene glycol) once or twice a day as needed. ° °If you have tried all these things and are unable to have a bowel  movement in the first 3-4 days after surgery call either your surgeon or your primary doctor.   ° °If you experience loose stools or diarrhea, hold the medications until you stool forms back up.  If your symptoms do not get better within 1 week or if they get worse, check with your doctor.  If you experience "the worst abdominal pain ever" or develop nausea or vomiting, please contact the office immediately for further recommendations for treatment. ° ° °ITCHING:  If you experience itching with your medications, try taking only a single pain pill, or even half a pain pill at a time.  You can also use Benadryl over the counter for itching or also to help with sleep.  ° °TED HOSE STOCKINGS:  Use stockings on both legs until for at least 2 weeks or as   directed by physician office. They may be removed at night for sleeping.  MEDICATIONS:  See your medication summary on the After Visit Summary that nursing will review with you.  You may have some home medications which will be placed on hold until you complete the course of blood thinner medication.  It is important for you to complete the blood thinner medication as prescribed.  PRECAUTIONS:  If you experience chest pain or shortness of breath - call 911 immediately for transfer to the hospital emergency department.   If you develop a fever greater that 101 F, purulent drainage from wound, increased redness or drainage from wound, foul odor from the wound/dressing, or calf pain - CONTACT YOUR SURGEON.                                                   FOLLOW-UP APPOINTMENTS:  If you do not already have a post-op appointment, please call the office for an appointment to be seen by your surgeon.  Guidelines for how soon to be seen are listed in your After Visit Summary, but are typically between 1-4 weeks after surgery.  OTHER INSTRUCTIONS:   Knee Replacement:  Do not place pillow under knee, focus on keeping the knee straight while resting. CPM  instructions: 0-90 degrees, 2 hours in the morning, 2 hours in the afternoon, and 2 hours in the evening. Place foam block, curve side up under heel at all times except when in CPM or when walking.  DO NOT modify, tear, cut, or change the foam block in any way.  MAKE SURE YOU:   Understand these instructions.   Get help right away if you are not doing well or get worse.    Thank you for letting us be a part of your medical care team.  It is a privilege we respect greatly.  We hope these instructions will help you stay on track for a fast and full recovery!   -----------------------------------------------------------------------------  Information on my medicine - XARELTO (Rivaroxaban)  This medication education was reviewed with me or my healthcare representative as part of my discharge preparation.  The pharmacist that spoke with me during my hospital stay was:  Dennie Fetters, [facility]  Why was Xarelto prescribed for you? Xarelto was prescribed for you to reduce the risk of blood clots forming after orthopedic surgery. The medical term for these abnormal blood clots is venous thromboembolism (VTE).  What do you need to know about xarelto ? Take your Xarelto ONCE DAILY at the same time every day. You may take it either with or without food.  If you have difficulty swallowing the tablet whole, you may crush it and mix in applesauce just prior to taking your dose.  Take Xarelto exactly as prescribed by your doctor and DO NOT stop taking Xarelto without talking to the doctor who prescribed the medication.  Stopping without other VTE prevention medication to take the place of Xarelto may increase your risk of developing a clot.  After discharge, you should have regular check-up appointments with your healthcare provider that is prescribing your Xarelto.    What do you do if you miss a dose? If you miss a dose, take it as soon as you remember on the same day then continue your  regularly scheduled once daily regimen the next day. Do not take  two doses of Xarelto on the same day.   Important Safety Information A possible side effect of Xarelto is bleeding. You should call your healthcare provider right away if you experience any of the following: ? Bleeding from an injury or your nose that does not stop. ? Unusual colored urine (red or dark brown) or unusual colored stools (red or black). ? Unusual bruising for unknown reasons. ? A serious fall or if you hit your head (even if there is no bleeding).  Some medicines may interact with Xarelto and might increase your risk of bleeding while on Xarelto. To help avoid this, consult your healthcare provider or pharmacist prior to using any new prescription or non-prescription medications, including herbals, vitamins, non-steroidal anti-inflammatory drugs (NSAIDs) and supplements.  This website has more information on Xarelto: VisitDestination.com.br.

## 2014-11-01 NOTE — Progress Notes (Signed)
Patient ID: Phillip Cook, male   DOB: February 03, 1956, 59 y.o.   MRN: [id number]     Subjective:  Patient reports pain as mild to moderate.  Patient doing better and his pain is controlled with the PCA.  Denies any CP or SOB  Objective:   VITALS:   Filed Vitals:   11/01/14 0000 11/01/14 0101 11/01/14 0400 11/01/14 0435  BP:  113/61  102/52  Pulse:  83  76  Temp:  99.2 F (37.3 C)  99.1 F (37.3 C)  TempSrc:    Oral  Resp: Weight:      SpO2: 99% 95% 100% 99%    ABD soft Sensation intact distally Dorsiflexion/Plantar flexion intact Incision: dressing C/D/I and no drainage Good foot and ankle motion  Lab Results  Component Value Date   WBC 10.3 11/01/2014   HGB 13.4 11/01/2014   HCT 39.9 11/01/2014   MCV 86.9 11/01/2014   PLT 273 11/01/2014   BMET    Component Value Date/Time   NA 130* 11/01/2014 0430   K 4.1 11/01/2014 0430   CL 97* 11/01/2014 0430   CO2 27 11/01/2014 0430   GLUCOSE 104* 11/01/2014 0430   BUN 12 11/01/2014 0430   CREATININE 1.11 11/01/2014 0430   CALCIUM 8.6* 11/01/2014 0430   GFRNONAA >60 11/01/2014 0430   GFRAA >60 11/01/2014 0430     Assessment/Plan: 1 Day Post-Op   Principal Problem:   Avascular necrosis of bone of left hip Active Problems:   Hip arthritis   Enlarged prostate   Constipation due to pain medication   Hypertension   Advance diet Up with therapy Continue pain regimen and will begin to taper off IV meds to PO meds WBAT Dry dressing PRN Plan for DC tomorrow or Friday   Haskel Khan 11/01/2014, 7:43 AM  Plan to DC pca tomorrow.  Dc home prob fri.  Monitor for urinary output, hopefully won't develop retention.    Teryl Lucy, MD Cell [phone]

## 2014-11-01 NOTE — Progress Notes (Signed)
Physical Therapy Treatment Patient Details Name: Samit Sylve MRN: [id number] DOB: 04-02-1956 Today's Date: 11/01/2014    History of Present Illness Patient is a 59 y/o male s/p L THA, posterior approach. 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 Improved ambulation distance. Pt better able to get into bed using strap to assist with LLE. Will plan for stair training tomorrow as tolerated to prepare pt for discharge home. Will follow acutely to maximize independence and mobility.   Follow Up Recommendations  Home health PT;Supervision/Assistance - 24 hour     Equipment Recommendations  None recommended by PT    Recommendations for Other Services OT consult     Precautions / Restrictions Precautions Precautions: Posterior Hip;Fall Precaution Booklet Issued: No Precaution Comments: Reviewed hip precautions.  Restrictions Weight Bearing Restrictions: Yes LLE Weight Bearing: Weight bearing as tolerated    Mobility  Bed Mobility Overal bed mobility: Needs Assistance Bed Mobility: Sit to Supine     Sit to supine: Min guard   General bed mobility comments: HOB flat, no use of rails. Use of strap to assist bringing LLE into bed.   Transfers Overall transfer level: Needs assistance Equipment used: Rolling walker (2 wheeled) Transfers: Sit to/from Stand Sit to Stand: Min assist         General transfer comment: Min A to boost from recliner x1 with cues for hand placemet/foot placement and momentum progressing to MIn gaurd assist.   Ambulation/Gait Ambulation/Gait assistance: Min guard Ambulation Distance (Feet): 150 Feet Assistive device: Rolling walker (2 wheeled) Gait Pattern/deviations: Step-through pattern;Decreased stride length   Gait velocity interpretation: Below normal speed for age/gender General Gait Details: Cues to decrease stride length and for pursed lip breathing as pts  Sa02 dropped to 80s during gait.    Stairs            Wheelchair Mobility    Modified Rankin (Stroke Patients Only)       Balance Overall balance assessment: Needs assistance Sitting-balance support: Feet supported;No upper extremity supported Sitting balance-Leahy Scale: Good     Standing balance support: During functional activity Standing balance-Leahy Scale: Fair Standing balance comment: Relient on RW for support for ambulation. Tolerated blowing nose without UE support for short period.                    Cognition Arousal/Alertness: Awake/alert Behavior During Therapy: WFL for tasks assessed/performed Overall Cognitive Status: Within Functional Limits for tasks assessed       Memory: Decreased short-term memory              Exercises Total Joint Exercises Ankle Circles/Pumps: Both;10 reps;Supine Quad Sets: Both;10 reps;Supine Gluteal Sets: Both;10 reps;Supine    General Comments General comments (skin integrity, edema, etc.): Pt's significant other present during session.      Pertinent Vitals/Pain Pain Assessment: Faces Faces Pain Scale: Hurts little more Pain Location: left hip Pain Descriptors / Indicators: Sore Pain Intervention(s): Monitored during session;PCA encouraged;Repositioned    Home Living Family/patient expects to be discharged to:: Private residence Living Arrangements: Alone;Spouse/significant other   Type of Home: House Home Access: Stairs to enter Entrance Stairs-Rails: Right Home Layout: One level Home Equipment: Environmental consultant - 2 wheels;Bedside commode      Prior Function Level of Independence: Independent with assistive device(s)      Comments: Pt using SPC PTA. Pt reports he may stay with significant other, [name] however she  has 2 flights of steps to get into home and then 1 flight to get to bedroom.    PT Goals (current goals can now be found in the care plan section) Acute Rehab PT Goals Patient Stated Goal:  to go home this time around PT Goal Formulation: With patient Time For Goal Achievement: 11/15/14 Potential to Achieve Goals: Good Progress towards PT goals: Progressing toward goals    Frequency  7X/week    PT Plan Current plan remains appropriate    Co-evaluation             End of Session Equipment Utilized During Treatment: Gait belt Activity Tolerance: Patient tolerated treatment well Patient left: in bed;with call bell/phone within reach;with family/visitor present;with nursing/sitter in room     Time: 1333-1401 PT Time Calculation (min) (ACUTE ONLY): 28 min  Charges:  $Gait Training: 8-22 mins $Therapeutic Activity: 8-22 mins                    G Codes:      Akaisha Truman A Latishia Suitt 11/01/2014, 2:14 PM Mylo Red, PT, DPT [phone]

## 2014-11-01 NOTE — Evaluation (Signed)
Physical Therapy Evaluation Patient Details Name: Phillip Cook MRN: [id number] DOB: 1955-08-20 Today's Date: 11/01/2014   History of Present Illness  Patient is a 59 y/o male s/p L THA, posterior approach. 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 Education provided on posterior hip precautions. Tolerated short distance ambulation with Min A for balance. Pt plans to go home with support from significant other however not sure if she can provide 24/7. Plan for gait training this PM. Will follow acutely to maximize independence and mobility prior to return home.    Follow Up Recommendations Home health PT;Supervision/Assistance - 24 hour    Equipment Recommendations  None recommended by PT    Recommendations for Other Services OT consult     Precautions / Restrictions Precautions Precautions: Posterior Hip;Fall Precaution Booklet Issued: No Precaution Comments: Reviewed hip precautions. Able to recall 1/3 from prior surgery ~ 1 month ago. Restrictions Weight Bearing Restrictions: Yes LLE Weight Bearing: Weight bearing as tolerated      Mobility  Bed Mobility Overal bed mobility: Needs Assistance Bed Mobility: Supine to Sit     Supine to sit: Mod assist;HOB elevated     General bed mobility comments: Assist to bring LLE to EOB and scoot bottom using chuck pad; cues for technique. Heavy use of rail for support.   Transfers Overall transfer level: Needs assistance Equipment used: Rolling walker (2 wheeled) Transfers: Sit to/from Stand Sit to Stand: Min assist         General transfer comment: Min A to boost from EOB with cues for hand placement. No dizziness.   Ambulation/Gait Ambulation/Gait assistance: Min assist Ambulation Distance (Feet): 10 Feet Assistive device: Rolling walker (2 wheeled) Gait Pattern/deviations: Step-to pattern;Decreased stance time -  left;Decreased step length - right;Trunk flexed   Gait velocity interpretation: Below normal speed for age/gender General Gait Details: Slow, mildly unsteady gait. Sa02 dropped to 82% on RA. Cues for pursed lip breathing. Cues for knee extension during stance phase to activate quadriceps.  Stairs            Wheelchair Mobility    Modified Rankin (Stroke Patients Only)       Balance Overall balance assessment: Needs assistance Sitting-balance support: Feet supported;No upper extremity supported Sitting balance-Leahy Scale: Fair     Standing balance support: During functional activity Standing balance-Leahy Scale: Fair Standing balance comment: Relient on RW for support for ambulation. Tolerated blowing nose without UE support for short period.                             Pertinent Vitals/Pain Pain Assessment: Faces Faces Pain Scale: Hurts little more Pain Location: left hip Pain Descriptors / Indicators: Sore Pain Intervention(s): Limited activity within patient's tolerance;Monitored during session;Repositioned;PCA encouraged    Home Living Family/patient expects to be discharged to:: Private residence Living Arrangements: Alone;Spouse/significant other   Type of Home: House Home Access: Stairs to enter Entrance Stairs-Rails: Right Entrance Stairs-Number of Steps: 3 Home Layout: One level Home Equipment: Walker - 2 wheels;Bedside commode      Prior Function Level of Independence: Independent with assistive device(s)         Comments: Pt using SPC PTA. Pt reports he may stay with significant other, [name] however she has 2 flights of steps to get into home and then 1 flight to get to bedroom.  Hand Dominance        Extremity/Trunk Assessment   Upper Extremity Assessment: Defer to OT evaluation           Lower Extremity Assessment: LLE deficits/detail   LLE Deficits / Details: Limited AROM/strength secondary to pain and  surgery.     Communication   Communication: No difficulties  Cognition Arousal/Alertness: Awake/alert Behavior During Therapy: WFL for tasks assessed/performed Overall Cognitive Status: Within Functional Limits for tasks assessed       Memory: Decreased short-term memory              General Comments      Exercises Total Joint Exercises Ankle Circles/Pumps: Both;10 reps;Supine Quad Sets: Both;10 reps;Supine Gluteal Sets: Both;10 reps;Supine      Assessment/Plan    PT Assessment Patient needs continued PT services  PT Diagnosis Difficulty walking;Acute pain   PT Problem List Decreased strength;Pain;Decreased range of motion;Decreased activity tolerance;Decreased balance;Decreased mobility;Decreased safety awareness;Cardiopulmonary status limiting activity  PT Treatment Interventions Balance training;Gait training;Functional mobility training;Therapeutic activities;Therapeutic exercise;Patient/family education;Stair training   PT Goals (Current goals can be found in the Care Plan section) Acute Rehab PT Goals Patient Stated Goal: to go home this time around PT Goal Formulation: With patient Time For Goal Achievement: 11/15/14 Potential to Achieve Goals: Good    Frequency 7X/week   Barriers to discharge Decreased caregiver support;Inaccessible home environment Pt lives alone, not sure if he can have 24/.7 S at d/c. Has steps to climb to get into home.    Co-evaluation               End of Session Equipment Utilized During Treatment: Gait belt Activity Tolerance: Patient tolerated treatment well Patient left: in chair;with call bell/phone within reach Nurse Communication: Mobility status         Time: 1610-9604 PT Time Calculation (min) (ACUTE ONLY): 25 min   Charges:   PT Evaluation $Initial PT Evaluation Tier I: 1 Procedure PT Treatments $Therapeutic Activity: 8-22 mins   PT G Codes:        Luan Maberry A Soffia Doshier 11/01/2014, 10:40 AM Mylo Red, PT, DPT [phone]

## 2014-11-01 NOTE — Plan of Care (Signed)
Problem: Consults Goal: Diagnosis- Total Joint Replacement Primary Total Hip     

## 2014-11-01 NOTE — Progress Notes (Signed)
Occupational Therapy Evaluation Patient Details Name: Phillip Cook MRN: [id number] DOB: 1955-05-31 Today's Date: 11/01/2014    History of Present Illness Patient is a 59 y/o male s/p L THA, posterior approach. 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 Patient independent -> mod I PTA. Patient currently functioning at an overall min assist level for functional mobility and overall mod assist level for LB ADLs. Patient will benefit from acute OT to increase overall independence in the areas of ADLs, functional mobility, and overall safety in order to safely discharge home. At this time, recommending HHOT secondary to patient normally lives alone. He states his significant other plans to be there the first few days, but will be living alone after that.     Follow Up Recommendations  Supervision - Intermittent;Home health OT    Equipment Recommendations  Other (comment) (LH sponge, LH shoe horn, reacher)    Recommendations for Other Services  None at this time   Precautions / Restrictions Precautions Precautions: Posterior Hip;Fall Precaution Booklet Issued: No Precaution Comments: Reviewed hip precautions. Able to recall 2/3 from prior surgery ~ 1 month ago (forgot no crossing) Restrictions Weight Bearing Restrictions: Yes LLE Weight Bearing: Weight bearing as tolerated    Mobility Bed Mobility - Per PT note (pt up in recliner upon OT entering room) Overal bed mobility: Needs Assistance Bed Mobility: Supine to Sit     Supine to sit: Mod assist;HOB elevated     General bed mobility comments: Assist to bring LLE to EOB and scoot bottom using chuck pad; cues for technique. Heavy use of rail for support.   Transfers Overall transfer level: Needs assistance Equipment used: Rolling walker (2 wheeled) Transfers: Sit to/from Stand Sit to  Stand: Min assist         General transfer comment: Min A to boost from recliner with cues for hand placement. No dizziness.     Balance Overall balance assessment: Needs assistance Sitting-balance support: No upper extremity supported;Feet supported Sitting balance-Leahy Scale: Fair     Standing balance support: Bilateral upper extremity supported;During functional activity Standing balance-Leahy Scale: Fair Standing balance comment: Relient on RW for support for ambulation. Tolerated blowing nose without UE support for short period.    ADL Overall ADL's : Needs assistance/impaired General ADL Comments: Pt states he uses cane to doff socks. Pt stated he has a sock aid at home. Recommended pt purchase reacher, LH sponge, and LH shoe horn to increase ADL independence. Pt ambualted <> BR for toilet transfer on/off BSC. Discussed use of sheet to assist with LLE management during bed mobility. Plan to educate patient on safest and most effective way to perform tub/shower transfer next OT session.     Pertinent Vitals/Pain Pain Assessment: Faces Faces Pain Scale: Hurts little more Pain Location: left hip Pain Descriptors / Indicators: Sore Pain Intervention(s): Monitored during session;Repositioned     Hand Dominance Right   Extremity/Trunk Assessment Upper Extremity Assessment Upper Extremity Assessment: Overall WFL for tasks assessed   Lower Extremity Assessment Lower Extremity Assessment: Defer to PT evaluation LLE Deficits / Details: Limited AROM/strength secondary to pain and surgery. LLE Sensation:  [facility].)   Cervical / Trunk Assessment Cervical / Trunk Assessment: Normal   Communication Communication Communication: No difficulties   Cognition Arousal/Alertness: Awake/alert Behavior During Therapy: WFL for tasks assessed/performed Overall Cognitive Status: Within Functional Limits for tasks assessed  Memory: Decreased short-term memory              Home  Living Family/patient expects to be discharged to:: Private residence Living Arrangements: Alone;Spouse/significant other   Type of Home: House Home Access: Stairs to enter [employer] of Steps: 3 Entrance Stairs-Rails: Right Home Layout: One level     Bathroom Shower/Tub: IT trainer: Standard     Home Equipment: Environmental consultant - 2 wheels;Bedside commode   Prior Functioning/Environment Level of Independence: Independent with assistive device(s)        Comments: Pt using SPC PTA. Pt reports he may stay with significant other, Phillip Cook however she has 2 flights of steps to get into home and then 1 flight to get to bedroom.     OT Diagnosis: Generalized weakness;Acute pain   OT Problem List: Decreased strength;Decreased range of motion;Decreased activity tolerance;Impaired balance (sitting and/or standing);Decreased safety awareness;Decreased knowledge of use of DME or AE;Decreased knowledge of precautions;Pain   OT Treatment/Interventions: Self-care/ADL training;Therapeutic exercise;Energy conservation;DME and/or AE instruction;Therapeutic activities;Patient/family education;Balance training    OT Goals(Current goals can be found in the care plan section) Acute Rehab OT Goals Patient Stated Goal: to go home this time around OT Goal Formulation: With patient/family Time For Goal Achievement: 11/15/14 Potential to Achieve Goals: Good ADL Goals Pt Will Perform Grooming: with modified independence;standing Pt Will Perform Lower Body Bathing: with modified independence;sit to/from stand;with adaptive equipment Pt Will Perform Lower Body Dressing: with modified independence;sit to/from stand;with adaptive equipment Pt Will Transfer to Toilet: with modified independence;ambulating;bedside commode Pt Will Perform Tub/Shower Transfer: Tub transfer;ambulating;rolling walker;with modified independence;3 in 1 Additional ADL Goal #1: Pt will independently  verbalize and adhere to posterior hip precautions 100% of the time  OT Frequency: Min 2X/week   Barriers to D/C: None known at this time   End of Session Equipment Utilized During Treatment: Gait belt;Rolling walker  Activity Tolerance: Patient tolerated treatment well Patient left: in chair;with call bell/phone within reach;with family/visitor present   Time: 1610-9604 OT Time Calculation (min): 30 min Charges:  OT General Charges $OT Visit: 1 Procedure OT Evaluation $Initial OT Evaluation Tier I: 1 Procedure OT Treatments $Self Care/Home Management : 8-22 mins  Amalea Ottey , MS, OTR/L, CLT Pager: [phone]  11/01/2014, 11:32 AM

## 2014-11-02 ENCOUNTER — Encounter (HOSPITAL_COMMUNITY): Payer: Self-pay | Admitting: General Practice

## 2014-11-02 LAB — CBC
HCT: 35.3 % — ABNORMAL LOW (ref 39.0–52.0)
Hemoglobin: 11.8 g/dL — ABNORMAL LOW (ref 13.0–17.0)
MCH: 28.9 pg (ref 26.0–34.0)
MCHC: 33.4 g/dL (ref 30.0–36.0)
MCV: 86.3 fL (ref 78.0–100.0)
Platelets: 278 10*3/uL (ref 150–400)
RBC: 4.09 MIL/uL — ABNORMAL LOW (ref 4.22–5.81)
RDW: 14.4 % (ref 11.5–15.5)
WBC: 15.1 10*3/uL — ABNORMAL HIGH (ref 4.0–10.5)

## 2014-11-02 LAB — BASIC METABOLIC PANEL
Anion gap: 5 (ref 5–15)
BUN: 14 mg/dL (ref 6–20)
CO2: 25 mmol/L (ref 22–32)
Calcium: 8.9 mg/dL (ref 8.9–10.3)
Chloride: 105 mmol/L (ref 101–111)
Creatinine, Ser: 0.92 mg/dL (ref 0.61–1.24)
GFR calc Af Amer: >60 mL/min (ref >60)
Glucose, Bld: 149 mg/dL — ABNORMAL HIGH (ref 65–99)
POTASSIUM: 4.2 mmol/L (ref 3.5–5.1)
Sodium: 135 mmol/L (ref 135–145)

## 2014-11-02 MED ORDER — ENSURE ENLIVE PO LIQD
237.0000 mL | Freq: Every day | ORAL | Status: DC
  Administered 2014-11-02 – 2014-11-03 (×2): 237 mL via ORAL

## 2014-11-02 MED ORDER — MINERAL OIL RE ENEM
1.0000 | ENEMA | Freq: Once | RECTAL | Status: AC
  Administered 2014-11-02: 1 via RECTAL
  Filled 2014-11-02 (×3): qty 1

## 2014-11-02 NOTE — Progress Notes (Signed)
Physical Therapy Treatment Patient Details Name: Phillip Cook MRN: [id number] DOB: Apr 18, 1955 Today's Date: 11/02/2014    History of Present Illness Patient is a 59 y/o male s/p L THA, posterior approach. 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 Performed stair training this AM and pt with LOB when ascending steps due to impulsivity (placing operative leg up first) despite cues for safe technique. PCA pump d/ced and pt concerned about pain management. Provided HEP handout and instructed pt in exercises. Pt now plans to go home to g/f's house and needs to be able to negotiate 1 flight of steps to get into home. Will plan to for stair training this PM as tolerated. Continues to require cues for safety during mobility. Will follow acutely.   Follow Up Recommendations  Home health PT;Supervision/Assistance - 24 hour     Equipment Recommendations  None recommended by PT    Recommendations for Other Services       Precautions / Restrictions Precautions Precautions: Posterior Hip;Fall Precaution Booklet Issued: Yes (comment) Precaution Comments: Pt able to recall 3/3 hip precautions. Restrictions Weight Bearing Restrictions: Yes LLE Weight Bearing: Weight bearing as tolerated    Mobility  Bed Mobility               General bed mobility comments: Pt standing by bed reaching down to untangle phone upon PT arrival without RW. RN notified.   Transfers Overall transfer level: Needs assistance Equipment used: Rolling walker (2 wheeled) Transfers: Sit to/from Stand Sit to Stand: Min guard         General transfer comment: Min guard for safety. Stood from [employer], from toilet x1.  Use of momentum to stand.  Ambulation/Gait Ambulation/Gait assistance: Supervision Ambulation Distance (Feet): 175 Feet Assistive device: Rolling walker (2 wheeled) Gait  Pattern/deviations: Step-through pattern;Decreased stride length   Gait velocity interpretation: Below normal speed for age/gender General Gait Details: Cues to decrease stride length and to keep both UEs on RW for safety. Pt attempting to ambulate without RW and to take hands off RW- discussed this is not safe!   Stairs Stairs: Yes Stairs assistance: Mod assist Stair Management: One rail Right;One rail Left;Forwards Number of Stairs: 3 (+ 2 steps x2 bouts) General stair comments: PT attempting to explain technique however pt wanting to do it his own way folding walker in half and using as cane. Very impulsive with LOB due to putting operative leg up first to ascend despite cues for safety. Mod A to prevent fall.   Wheelchair Mobility    Modified Rankin (Stroke Patients Only)       Balance Overall balance assessment: Needs assistance Sitting-balance support: Feet supported;No upper extremity supported Sitting balance-Leahy Scale: Good     Standing balance support: During functional activity Standing balance-Leahy Scale: Fair   Upon entering room, pt standing by bed and attempting to get to chair, untangling lines as pt tangled in them. Discussed how unsafe this was and RN notified.                     Cognition Arousal/Alertness: Awake/alert Behavior During Therapy: Impulsive Overall Cognitive Status: Within Functional Limits for tasks assessed                      Exercises Total Joint Exercises Ankle Circles/Pumps: Both;10 reps;Seated Quad Sets: Both;10 reps;Seated Heel Slides:  Left;10 reps;Seated Long Arc Quad: 10 reps;Both;Seated    General Comments        Pertinent Vitals/Pain Pain Assessment: Faces Faces Pain Scale: Hurts little more Pain Location: left hip Pain Descriptors / Indicators: Sore Pain Intervention(s): Monitored during session;Repositioned;RN gave pain meds during session (PCA discontinued during session.)    Home Living                       Prior Function            PT Goals (current goals can now be found in the care plan section) Progress towards PT goals: Progressing toward goals    Frequency  7X/week    PT Plan Current plan remains appropriate    Co-evaluation             End of Session Equipment Utilized During Treatment: Gait belt Activity Tolerance: Patient tolerated treatment well Patient left: in chair;with call bell/phone within reach     Time: 0847-0922 PT Time Calculation (min) (ACUTE ONLY): 35 min  Charges:  $Gait Training: 8-22 mins $Therapeutic Exercise: 8-22 mins                    G Codes:      Avigdor Dollar A Serria Sloma 11/02/2014, 10:42 AM Mylo Red, PT, DPT [phone]

## 2014-11-02 NOTE — Progress Notes (Signed)
Occupational Therapy Treatment Patient Details Name: Phillip Cook MRN: [id number] DOB: 08-03-1955 Today's Date: 11/02/2014    History of present illness Patient is a 59 y/o male s/p L THA, posterior approach. 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 Recommend HHOT to reinforce hip precautions and practice tub transfers--began explanation and demonstrations this session but pt not ready to safely step into tub. He was adamant about trying to lift a 3in1 into the tub on his own and therapist declined letting pt attempt this. Will follow while on acute.    Follow Up Recommendations  Home health OT;Supervision/Assistance - 24 hour    Equipment Recommendations   (AE including extra long shoe horn, reacher and long handle sponge)    Recommendations for Other Services      Precautions / Restrictions Precautions Precautions: Posterior Hip;Fall Precaution Booklet Issued: Yes (comment) Precaution Comments: Pt recalled 2/3 hip precautions. Reviewed all with pt. Restrictions Weight Bearing Restrictions: Yes LLE Weight Bearing: Weight bearing as tolerated       Mobility Bed Mobility Overal bed mobility: Needs Assistance Bed Mobility: Sit to Supine       Sit to supine: Min guard   General bed mobility comments: Pt standing by bed reaching down to untangle phone upon PT arrival without RW. RN notified.   Transfers Overall transfer level: Needs assistance Equipment used: Rolling walker (2 wheeled) Transfers: Sit to/from Stand Sit to Stand: Min guard         General transfer comment: cues for safety, hand placement and LE management.    Balance Overall balance assessment: Needs assistance Sitting-balance support: Feet supported;No upper extremity supported Sitting balance-Leahy Scale: Good      Standing balance support: During functional activity Standing balance-Leahy Scale: Fair                     ADL                       Lower Body Dressing: Minimal assistance;Sit to/from stand Lower Body Dressing Details (indicate cue type and reason): see notes below.               General ADL Comments: Pt only able to state 2/3 hip precautions so reviewed all with pt. He has a sock aid and plans to still use the cane to don pants, doff socks. He states someone he is friends with can obtain an extra long shoe horn for him (regular long shoe horn is not long enough for pt and he plans to make his own long handled sponge. Pt is very adamant about trying to do things a certain way and has difficulty understanding why certain ways that he describes doing his ADL is not safe as he will break his hip precautions. He required much increased time during the session and multiple demonstrations by OT and explanations to begin to understand how he should complete his ADL in order to adhere to those precautions. STRONGLY recommend HHOT to reinforce safety and ADL /precautions at home. He is very adamant also about lifiing a 3in1 into the tub on his own to switch from toilet to tub. Explained this is a safety concern and that he should not attempt to do this on his own. he states he cant afford tub  DME at this time so explained he will need assist to move 3in1 from tub to toilet and back. Demonstrated both techniques with the 3in1 fully sitting in the tub and facing out of the tub as well as straddling tub  with 2 legs of chair out of tub. Demonstrated how to transfer onto seat both ways. Pt is agreeable to let HHOT assess tub transfer with him before he attempts on his own. Pt not able to make a definitive decision about which method he would like to try today. Practiced with long stick /cane to doff socks and don shorts. He needed cues to not bend forwared or lift his knee while donning pants.  He did better with practice. He needed min assist to straigthen sock as it was slightly twisted. Discussed extra long shoe horn and sponge and how to use. Also worked on functional mobility to retrieve clohting from closet on his own and he needed mod cues to do so safely. He tends to turn left LE inward and tries to leave walker to the side step around to closet. Demonstrated staying close to walker and not letting L toes turn inward.       Vision                     Perception     Praxis      Cognition   Behavior During Therapy: Impulsive Overall Cognitive Status: Within Functional Limits for tasks assessed       Memory: Decreased recall of precautions;Decreased short-term memory               Extremity/Trunk Assessment               Exercises    Shoulder Instructions       General Comments      Pertinent Vitals/ Pain       Pain Assessment: 0-10 Pain Score: 3  Faces Pain Scale: Hurts little more Pain Location: L hip Pain Descriptors / Indicators: Sore Pain Intervention(s): Repositioned  Home Living                                          Prior Functioning/Environment              Frequency Min 2X/week     Progress Toward Goals  OT Goals(current goals can now be found in the care plan section)  Progress towards OT goals: Progressing toward goals     Plan Discharge plan needs to be updated    Co-evaluation                 End of Session Equipment Utilized During Treatment: Rolling walker   Activity Tolerance Patient tolerated treatment well   Patient Left in bed;with call bell/phone within reach   Nurse Communication          Time: 6213-0865 OT Time Calculation (min): 41 min  Charges: OT General Charges $OT Visit: 1 Procedure OT Treatments $Self Care/Home Management : 23-37 mins $Therapeutic Activity: 8-22 mins  Lennox Laity  [phone] 11/02/2014, 12:32 PM

## 2014-11-02 NOTE — Care Management Note (Signed)
Case Management Note  Patient Details  Name: Phillip Cook MRN: [id number] Date of Birth: [birth year]  Subjective/Objective:           S/p left total hip arthroplasty         Action/Plan: Spoke with patient about home health, he chose [facility]. Patient is unsure if he will be going to a friend's house in [place] or to his home in [place]. He will know by 07/29. Patient states that he has a rolling walker and a 3N1 at home. Contacted Ahnaf Caponi with [facility] and set up HHPT, informed her of uncertainty of home address. Will continue to follow.     Expected Discharge Date:                  Expected Discharge Plan:  Home w Home Health Services  In-House Referral:  NA  Discharge planning Services  CM Consult  Post Acute Care Choice:  Home Health Choice offered to:  Patient  DME Arranged:    DME Agency:     HH Arranged:  PT HH Agency:  [facility]  Status of Service:  In process, will continue to follow  Medicare Important Message Given:    Date Medicare IM Given:    Medicare IM give by:    Date Additional Medicare IM Given:    Additional Medicare Important Message give by:     If discussed at Long Length of Stay Meetings, dates discussed:    Additional Comments:  Monica Becton, RN 11/02/2014, 11:42 AM

## 2014-11-02 NOTE — Progress Notes (Signed)
Patient ID: Phillip Cook, male   DOB: 1955/08/15, 59 y.o.   MRN: [id number]     Subjective:  Patient reports pain as mild.  Patient states that he is doing better and would like to go home tomorrow.  Denies any CP or SOB  Objective:   VITALS:   Filed Vitals:   11/01/14 2040 11/02/14 0000 11/02/14 0437 11/02/14 0626  BP: 113/65   116/68  Pulse: 106   98  Temp: 98.9 F (37.2 C)   98.7 F (37.1 C)  TempSrc:      Resp: Weight:      SpO2: 96% 96%  96%    ABD soft Sensation intact distally Dorsiflexion/Plantar flexion intact Incision: dressing C/D/I and no drainage   Lab Results  Component Value Date   WBC 15.1* 11/02/2014   HGB 11.8* 11/02/2014   HCT 35.3* 11/02/2014   MCV 86.3 11/02/2014   PLT 278 11/02/2014   BMET    Component Value Date/Time   NA 135 11/02/2014 0505   K 4.2 11/02/2014 0505   CL 105 11/02/2014 0505   CO2 25 11/02/2014 0505   GLUCOSE 149* 11/02/2014 0505   BUN 14 11/02/2014 0505   CREATININE 0.92 11/02/2014 0505   CALCIUM 8.9 11/02/2014 0505   GFRNONAA >60 11/02/2014 0505   GFRAA >60 11/02/2014 0505     Assessment/Plan: 2 Days Post-Op   Principal Problem:   Avascular necrosis of bone of left hip Active Problems:   Hip arthritis   Enlarged prostate   Constipation due to pain medication   Hypertension   Advance diet Up with therapy Plan for discharge tomorrow WBAT DC PCA today and begin PO pain regimen Dry dressing PRN   Phillip Cook, Phillip Cook 11/02/2014, 7:02 AM  Seen and agree with above.  Teryl Lucy, MD Cell [phone]

## 2014-11-02 NOTE — Progress Notes (Signed)
Physical Therapy Treatment Patient Details Name: Phillip Cook MRN: [id number] DOB: 09-18-1955 Today's Date: 11/02/2014    History of Present Illness Patient is a 59 y/o male s/p L THA, posterior approach. 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 Performed stair training in stairwell today and pt required Min guard for safety and max cues for technique. Pt attempting to challenge self to the extreme (pushing away RW, ascending with operative leg) resulting in impaired safety awareness/judgment and putting pt at increased risk for falls. Tolerated gait training without RW for short distance per pt request, however unsteady requiring MiIn A for balance. Lengthy discussion about safety at home and importance of using RW to decrease fall risk. Despite reinforcing hip precautions, pt continues to break precautions to donn pants and attempting to pick up object from floor. Will follow acutely to emphasize precautions/safety during mobility and maximize independence.   Follow Up Recommendations  Home health PT;Supervision/Assistance - 24 hour     Equipment Recommendations  None recommended by PT    Recommendations for Other Services       Precautions / Restrictions Precautions Precautions: Posterior Hip;Fall Precaution Booklet Issued: Yes (comment) Precaution Comments: Recalled 3/3 precautions. Restrictions Weight Bearing Restrictions: Yes LLE Weight Bearing: Weight bearing as tolerated    Mobility  Bed Mobility        General bed mobility comments: Standing in room upon PT arrival.   Transfers Overall transfer level: Needs assistance Equipment used: Rolling walker (2 wheeled) Transfers: Sit to/from Stand Sit to Stand: Supervision         General transfer comment: Supervision for safety. Stood from chair x2, from English as a second language teacher.  Pt continues to be impulsive and uses momentum to stand. Cues for  foot placement to better BoS as pt likes to extend operative leg prior to standing.  Ambulation/Gait Ambulation/Gait assistance: Supervision Ambulation Distance (Feet): 250 Feet (x2 bouts) Assistive device: Rolling walker (2 wheeled);None Gait Pattern/deviations: Step-through pattern;Antalgic   Gait velocity interpretation: Below normal speed for age/gender General Gait Details: Pt with circumduction left hip to advance LLE. Cues to decrease step length. Ambulated with and without RW. Min A for balance without RW. Encouraged RW use while in hospital.   Stairs Stairs: Yes Stairs assistance: Min guard Stair Management: One rail Right;Step to pattern Number of Stairs: 12 General stair comments: Cues for technique and safety.  Wheelchair Mobility    Modified Rankin (Stroke Patients Only)       Balance Overall balance assessment: Needs assistance Sitting-balance support: Feet supported;No upper extremity supported Sitting balance-Leahy Scale: Good     Standing balance support: During functional activity Standing balance-Leahy Scale: Fair Standing balance comment: Pt ambulating within bathroom trying to grab clothes from closet impulsively and without RW. Explained safety concerns.                     Cognition Arousal/Alertness: Awake/alert Behavior During Therapy: Impulsive Overall Cognitive Status: Within Functional Limits for tasks assessed       Memory: Decreased short-term memory              Exercises Other Exercises Other Exercises: standing hamcurls 2x10 BLEs. Pt attempting to perform SLS on left without UE support despite cues for safety and knee instability noted.    General Comments        Pertinent Vitals/Pain Pain Assessment: Faces Pain Score: 3  Faces Pain Scale:  Hurts a little bit Pain Location: left hip Pain Descriptors / Indicators: Sore Pain Intervention(s): Monitored during session;Repositioned    Home Living Family/patient  expects to be discharged to:: Private residence Living Arrangements: Alone                  Prior Function            PT Goals (current goals can now be found in the care plan section) Progress towards PT goals: Progressing toward goals    Frequency  7X/week    PT Plan Current plan remains appropriate    Co-evaluation             End of Session Equipment Utilized During Treatment: Gait belt Activity Tolerance: Patient tolerated treatment well Patient left: in chair;with call bell/phone within reach     Time: 1610-9604 PT Time Calculation (min) (ACUTE ONLY): 27 min  Charges:  $Gait Training: 23-37 mins                    G Codes:      Tryson Lumley A Taline Nass 11/02/2014, 2:40 PM Mylo Red, PT, DPT [phone]

## 2014-11-02 NOTE — Progress Notes (Signed)
Initial Nutrition Assessment  DOCUMENTATION CODES:   Not applicable  INTERVENTION:  Provide Ensure Enlive po once daily, each supplement provides 350 kcal and 20 grams of protein.  Encourage adequate PO intake.   NUTRITION DIAGNOSIS:   Increased nutrient needs related to  (surgery) as evidenced by estimated needs.  GOAL:   Patient will meet greater than or equal to 90% of their needs  MONITOR:   PO intake, Supplement acceptance, Weight trends, Labs, I & O's  REASON FOR ASSESSMENT:   Malnutrition Screening Tool    ASSESSMENT:   59 y.o. male who presents for preoperative history and physical with a diagnosis of avascular necrosis left hip. Symptoms are rated as moderate to severe, and have been worsening. Preoperative X-rays demonstrate end stage degenerative changes with osteophyte formation, loss of joint space, subchondral sclerosis. He also has collapse of the femoral head  PROCEDURE:(7/26): TOTAL HIP ARTHROPLASTY  Pt reports appetite has been improving. Current meal completion is 100%. Pt reports PTA he was not eating well due to severe pain and constipation. He reports he would eat a couple of bites of food at meals. Pt reports having gradual weight loss with usual weight of ~230 lbs, which he reports last weighing 2 years ago. RD to order Ensure to aid in healing.   Limited nutrition focused physical exam completed. No significant fat or muscle mass observed.   Diet Order:  Diet regular Room service appropriate?: Yes; Fluid consistency:: Thin  Skin:   (Incision on L hip, non-pitting LLE edema)  Last BM:  7/25  Height:   Ht Readings from Last 1 Encounters:  10/20/14  (1.88 m)    Weight:   Wt Readings from Last 1 Encounters:  10/31/14 181 lb 14.4 oz (82.509 kg)   Ideal Body Weight:  86 kg  BMI:  Body mass index is 23.34 kg/(m^2).  Estimated Nutritional Needs:   Kcal:  2200-2400  Protein:  105-115 grams  Fluid:  2.2 - 2.4 L/day  EDUCATION  NEEDS:   Education needs addressed  Roslyn Smiling, MS, RD, LDN Pager # [phone] After hours/ weekend pager # [phone]

## 2014-11-02 NOTE — Progress Notes (Signed)
Reviewed s/e of pain medication along with blood pressure medications and patient stated " I takes all of these medications at home". Will continue to monitor.   Sim Boast, RN

## 2014-11-02 NOTE — Discharge Summary (Signed)
Physician Discharge Summary  Patient ID: Phillip Cook MRN: [id number] DOB/AGE: 60/18/1957 61 y.o.  Admit date: 10/31/2014 Discharge date: 11/03/2014  Admission Diagnoses:  Avascular necrosis of bone of left hip  Discharge Diagnoses:  Principal Problem:   Avascular necrosis of bone of left hip Active Problems:   Hip arthritis   Enlarged prostate   Constipation due to pain medication   Hypertension   Past Medical History  Diagnosis Date  . Hypertension   . Hypothyroidism   . Primary localized osteoarthritis of right hip 09/19/2014  . Enlarged prostate   . Constipation due to pain medication   . Avascular necrosis of bone of left hip 10/31/2014    Surgeries: Procedure(s): TOTAL HIP ARTHROPLASTY on 10/31/2014   Consultants (if any):    Discharged Condition: Improved  Hospital Course: Phillip Cook is an 59 y.o. male who was admitted 10/31/2014 with a diagnosis of Avascular necrosis of bone of left hip and went to the operating room on 10/31/2014 and underwent the above named procedures.    He was given perioperative antibiotics:  Anti-infectives    Start     Dose/Rate Route Frequency Ordered Stop   10/31/14 1630  ceFAZolin (ANCEF) IVPB 2 g/50 mL premix     2 g 100 mL/hr over 30 Minutes Intravenous Every 6 hours 10/31/14 1458 11/01/14 0429   10/31/14 1000  ceFAZolin (ANCEF) IVPB 2 g/50 mL premix     2 g 100 mL/hr over 30 Minutes Intravenous On call to O.R. 10/31/14 7829 10/31/14 1038    .  He was given sequential compression devices, early ambulation, and xarelto for DVT prophylaxis.  He benefited maximally from the hospital stay and there were no complications.    Recent vital signs:  Filed Vitals:   11/02/14 1150  BP: 136/81  Pulse: 96  Temp: 98.3 F (36.8 C)  Resp: 16    Recent laboratory studies:  Lab Results  Component Value Date   HGB 11.8* 11/02/2014   HGB 13.4 11/01/2014   HGB 14.7 10/20/2014   Lab Results  Component Value Date   WBC 15.1* 11/02/2014    PLT 278 11/02/2014   No results found for: INR Lab Results  Component Value Date   NA 135 11/02/2014   K 4.2 11/02/2014   CL 105 11/02/2014   CO2 25 11/02/2014   BUN 14 11/02/2014   CREATININE 0.92 11/02/2014   GLUCOSE 149* 11/02/2014    Discharge Medications:     Medication List    TAKE these medications        amLODipine 5 MG tablet  Commonly known as:  NORVASC  Take 10 mg by mouth daily.     amphetamine-dextroamphetamine 20 MG tablet  Commonly known as:  ADDERALL  Take 20 mg by mouth daily.     ANDROGEL PUMP 20.25 MG/ACT (1.62%) Gel  Generic drug:  Testosterone  Apply 4 pumps onto skin daily     baclofen 10 MG tablet  Commonly known as:  LIORESAL  Take 1 tablet (10 mg total) by mouth 3 (three) times daily. As needed for muscle spasm     diazepam 5 MG tablet  Commonly known as:  VALIUM  Take 1 tablet (5 mg total) by mouth every 6 (six) hours as needed for anxiety.     HYDROmorphone 2 MG tablet  Commonly known as:  DILAUDID  Take 1 tablet (2 mg total) by mouth every 4 (four) hours as needed for severe pain.     levothyroxine 75  MCG tablet  Commonly known as:  SYNTHROID, LEVOTHROID  Take 150 mcg by mouth 2 (two) times daily.     lisinopril-hydrochlorothiazide 20-12.5 MG per tablet  Commonly known as:  PRINZIDE,ZESTORETIC  Take 1 tablet by mouth daily.     MOVANTIK 25 MG Tabs tablet  Generic drug:  naloxegol oxalate  Take 25 mg by mouth every morning.     ondansetron 4 MG tablet  Commonly known as:  ZOFRAN  Take 1 tablet (4 mg total) by mouth every 8 (eight) hours as needed for nausea or vomiting.     OVER THE COUNTER MEDICATION  Take 1 tablet by mouth as needed (constipation). OTC laxative     oxyCODONE 15 MG immediate release tablet  Commonly known as:  ROXICODONE  Take 1-2 tablets (15-30 mg total) by mouth 6 (six) times daily.     OXYCONTIN 30 MG T12a  Generic drug:  OxyCODONE HCl ER  Take 30 mg by mouth 2 (two) times daily.     rivaroxaban  10 MG Tabs tablet  Commonly known as:  XARELTO  Take 1 tablet (10 mg total) by mouth daily.     sennosides-docusate sodium 8.6-50 MG tablet  Commonly known as:  SENOKOT-S  Take 2 tablets by mouth daily.     tiZANidine 4 MG tablet  Commonly known as:  ZANAFLEX  Take 4 mg by mouth every 8 (eight) hours as needed for muscle spasms.     zolpidem 10 MG tablet  Commonly known as:  AMBIEN  Take 10 mg by mouth at bedtime.        Diagnostic Studies: Dg Pelvis Portable  10/31/2014   CLINICAL DATA:  Status post left hip arthroplasty.  EXAM: PORTABLE PELVIS 1-2 VIEWS  COMPARISON:  None.  FINDINGS: There is been interval placement of a left hip arthroplasty device. The hardware components are in anatomic alignment and no complications identified. Gas is identified within the left hip. Stable appearance of right hip arthroplasty device.  IMPRESSION: 1. Status post left hip arthroplasty.  No complications.   Electronically Signed   By: Signa Kell M.D.   On: 10/31/2014 14:25    Disposition: Home        Follow-up Information    Follow up with Eulas Post, MD. Schedule an appointment as soon as possible for a visit in 2 weeks.   Specialty:  Orthopedic Surgery   Contact information:   [address] [place] [address] [phone]       Follow up with [facility].   Why:  They will contact you to schedule home therapy visits.   Contact information:   [address] [place] [address] [phone]        Signed: Eulas Post 11/02/2014, 2:06 PM

## 2014-11-03 LAB — CBC
HEMATOCRIT: 32.9 % — AB (ref 39.0–52.0)
Hemoglobin: 10.9 g/dL — ABNORMAL LOW (ref 13.0–17.0)
MCH: 29.2 pg (ref 26.0–34.0)
MCHC: 33.1 g/dL (ref 30.0–36.0)
MCV: 88.2 fL (ref 78.0–100.0)
Platelets: 270 10*3/uL (ref 150–400)
RBC: 3.73 MIL/uL — AB (ref 4.22–5.81)
RDW: 14.8 % (ref 11.5–15.5)
WBC: 12.2 10*3/uL — ABNORMAL HIGH (ref 4.0–10.5)

## 2014-11-03 NOTE — Progress Notes (Signed)
Physical Therapy Treatment Patient Details Name: Phillip Cook MRN: [id number] DOB: [birth year] Today's Date: 11/03/2014    History of Present Illness Patient is a 59 y/o male s/p L THA, posterior approach. PMH includes HTN, hypothyroidism, R THA 09/2014 and arthritis     PT Comments    Pt doing well and is motivated to progress with therapy and go home. Tends to be slightly impulsive and will push himself but will forget about his safety in doing so. Ready to D/C today according to goals met set by PT. Continue to recommend HHPT for ongoing rehab to increase functional independence.   Follow Up Recommendations  Home health PT;Supervision/Assistance - 24 hour     Equipment Recommendations  None recommended by PT    Recommendations for Other Services       Precautions / Restrictions Precautions Precautions: Posterior Hip;Fall Precaution Comments: Recalled 3/3 precautions. Restrictions LLE Weight Bearing: Weight bearing as tolerated    Mobility  Bed Mobility Overal bed mobility: Modified Independent                Transfers Overall transfer level: Modified independent                  Ambulation/Gait Ambulation/Gait assistance: Supervision Ambulation Distance (Feet): 600 Feet Assistive device: Rolling walker (2 wheeled) Gait Pattern/deviations: Step-through pattern   Gait velocity interpretation: at or above normal speed for age/gender General Gait Details: Cues for knee extension in stance phase. Pt pushes his limits and needed cues to maintain safe gait speed. Supervision for safety.   Stairs Stairs: Yes Stairs assistance: Supervision Stair Management: One rail Right;Step to pattern Number of Stairs: 12 General stair comments: Cues for technique and safety.  Wheelchair Mobility    Modified Rankin (Stroke Patients Only)       Balance                                    Cognition Arousal/Alertness: Awake/alert Behavior During  Therapy: WFL for tasks assessed/performed Overall Cognitive Status: Within Functional Limits for tasks assessed                      Exercises Total Joint Exercises Gluteal Sets: Both;10 reps;Seated Hip ABduction/ADduction: AROM;Both;10 reps;Standing Knee Flexion: AROM;Both;10 reps;Standing Standing Hip Extension: AROM;Both;10 reps;Standing    General Comments        Pertinent Vitals/Pain Faces Pain Scale: Hurts a little bit Pain Location: L hip Pain Descriptors / Indicators: Sore Pain Intervention(s): Monitored during session;Repositioned    Home Living                      Prior Function            PT Goals (current goals can now be found in the care plan section) Progress towards PT goals: Progressing toward goals    Frequency  7X/week    PT Plan Current plan remains appropriate    Co-evaluation             End of Session Equipment Utilized During Treatment: Gait belt Activity Tolerance: Patient tolerated treatment well Patient left: in chair;with call bell/phone within reach     Time: 0833-0904 PT Time Calculation (min) (ACUTE ONLY): 31 min  Charges:                       G Codes:  Allyn Kenner, SPTA 11/03/2014, 9:12 AM

## 2015-05-07 ENCOUNTER — Encounter: Payer: Self-pay | Admitting: Internal Medicine

## 2015-10-30 ENCOUNTER — Encounter: Payer: Self-pay | Admitting: Internal Medicine

## 2016-01-05 IMAGING — CR DG PORTABLE PELVIS
1 series · 1 of 1 positions shown · non-contrast
Comparison: None.

CLINICAL DATA: Post hip replacement

EXAM:
PORTABLE PELVIS 1-2 VIEWS

[AP]
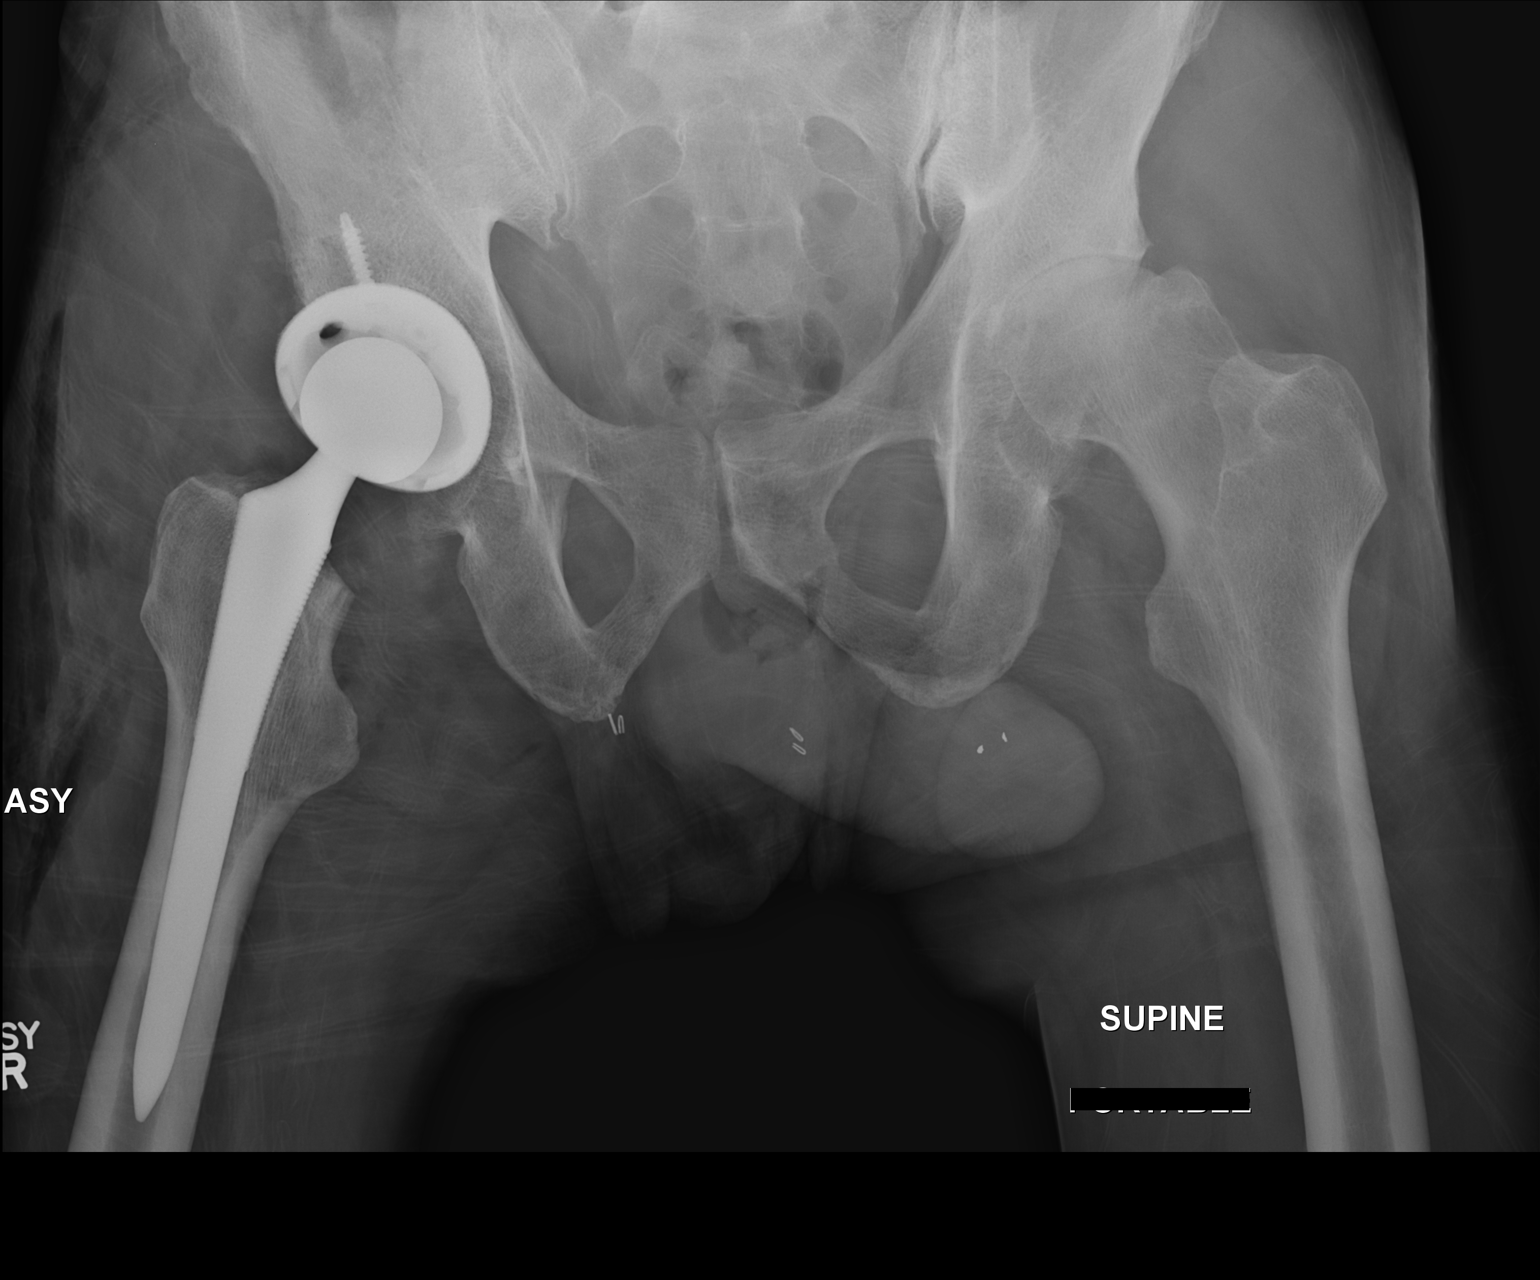

[1 of 1 positions shown; findings below may reference images not displayed]

FINDINGS: Staples and radiopaque possible soft tissue foreign bodies project
over the perineal region. Right total hip arthroplasty is in place.
No evidence for hardware failure or fracture.
IMPRESSION: Expected postoperative appearance after right total hip
arthroplasty.

## 2016-02-16 IMAGING — CR DG PORTABLE PELVIS
2 series · 2 of 2 positions shown · non-contrast
Comparison: None.

CLINICAL DATA: Status post left hip arthroplasty.

EXAM:
PORTABLE PELVIS 1-2 VIEWS

[AP (1 of 2)]
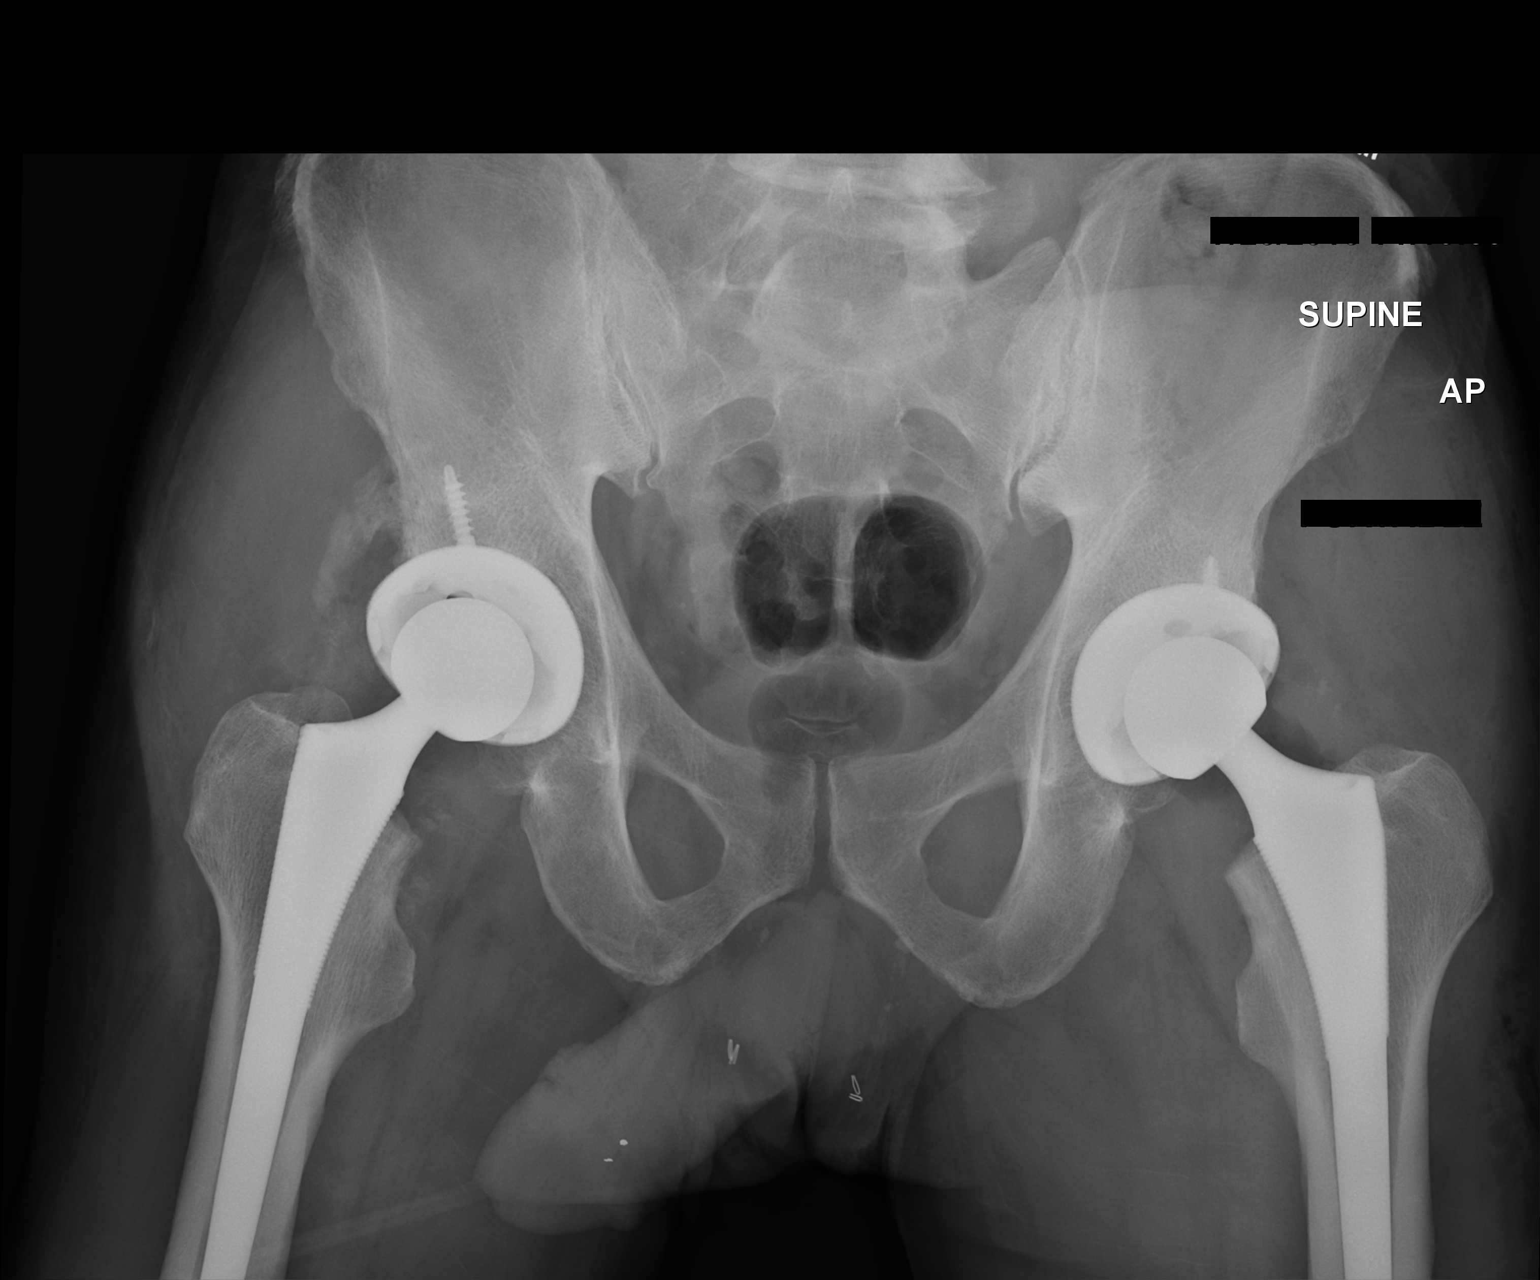

[AP (2 of 2)]
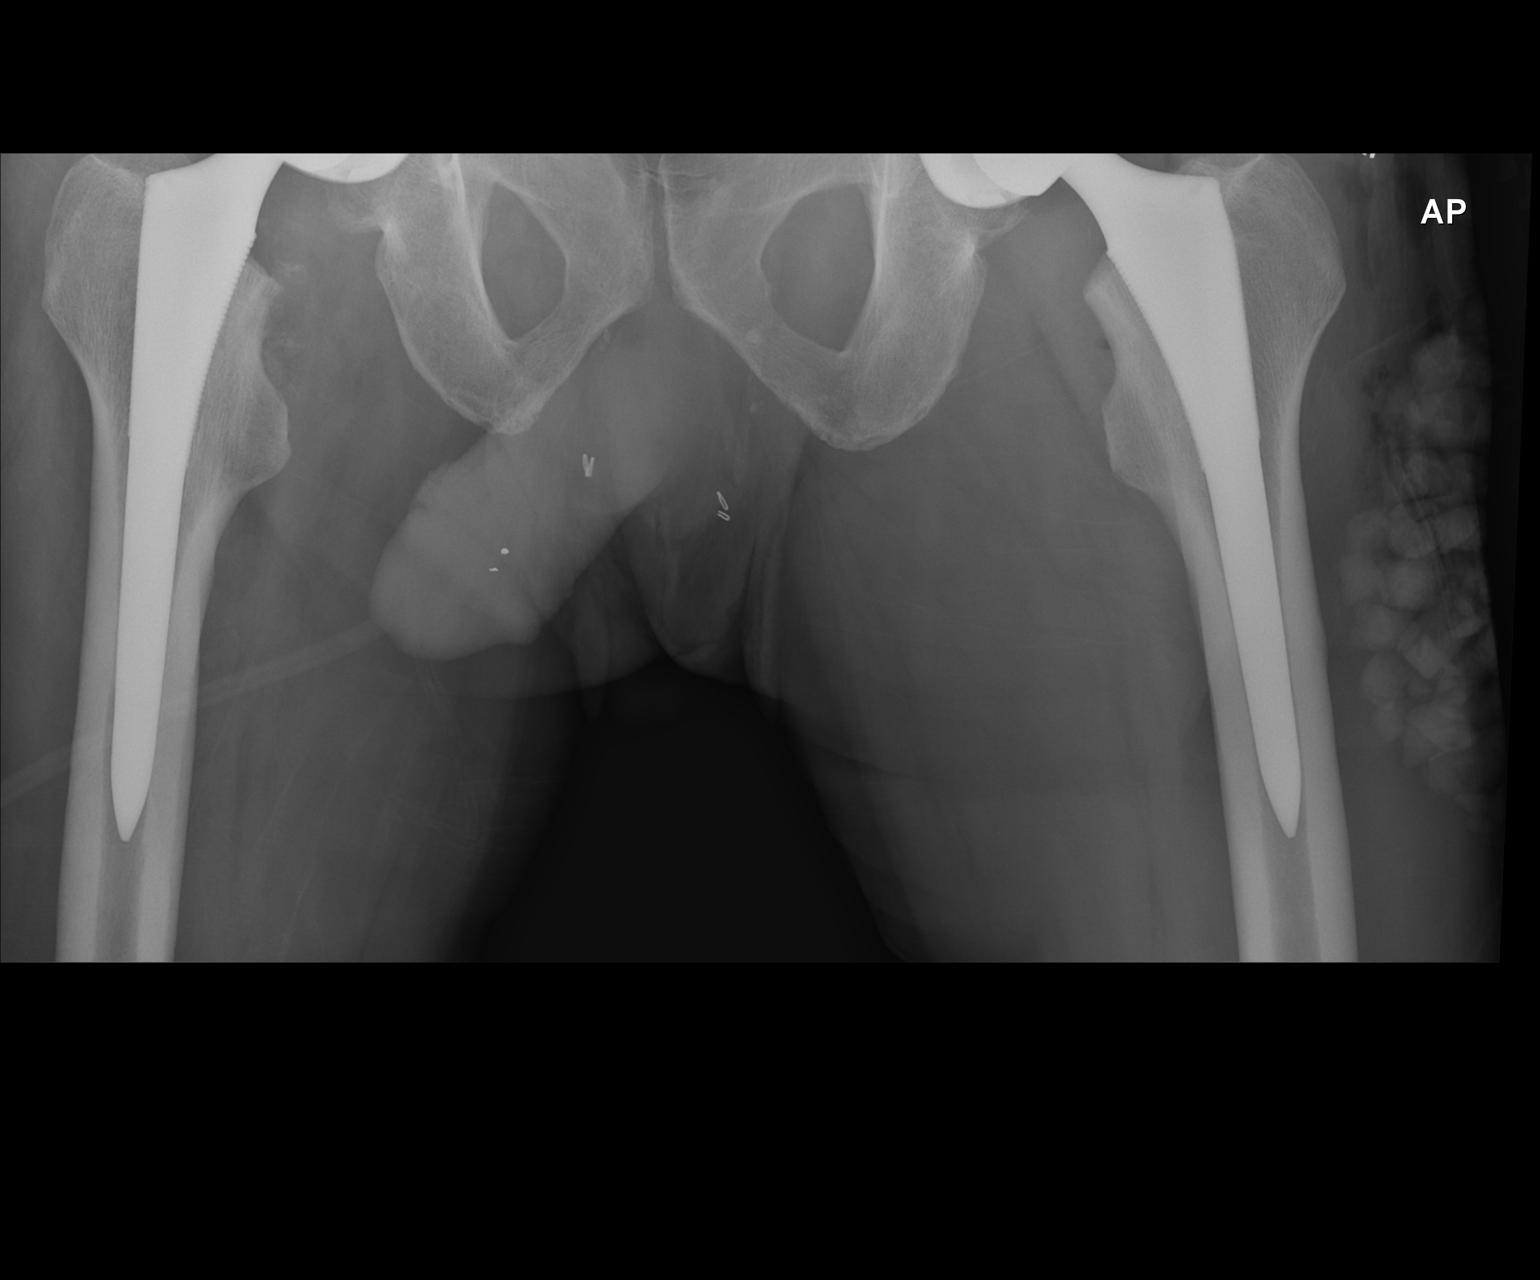

[2 of 2 positions shown; findings below may reference images not displayed]

FINDINGS: There is been interval placement of a left hip arthroplasty device.
The hardware components are in anatomic alignment and no
complications identified. Gas is identified within the left hip.
Stable appearance of right hip arthroplasty device.
IMPRESSION: 1. Status post left hip arthroplasty.  No complications.

## 2017-05-06 ENCOUNTER — Ambulatory Visit: Payer: Self-pay

## 2017-05-06 NOTE — Telephone Encounter (Signed)
Pt began having dizziness 4 days ago which he describes as spinning. He denies any weakness, numbness facial drooping or garbled speech. He states that he has the dizziness when bending over, or when he first lays in the bed.  He is currently retired but "makes too much for [employer]". States he has not been on his meds for a year.  Appt made for tomorrow with Dr. Creta LevinStallings. Reason for Disposition . [1] MODERATE dizziness (e.g., vertigo; feels very unsteady, interferes with normal activities) AND [2] has NOT been evaluated by physician for this  Answer Assessment - Initial Assessment Questions 1. DESCRIPTION: "Describe your dizziness."     Feels like the room is spinning 2. VERTIGO: "Do you feel like either you or the room is spinning or tilting?"      spinning 3. LIGHTHEADED: "Do you feel lightheaded?" (e.g., somewhat faint, woozy, weak upon standing)     Feels unsteaady 4. SEVERITY: "How bad is it?"  "Can you walk?"   - MILD - Feels unsteady but walking normally.   - MODERATE - Feels very unsteady when walking, but not falling; interferes with normal activities (e.g., school, work) .   - SEVERE - Unable to walk without falling (requires assistance).     Moderate initially now mild  5. ONSET:  "When did the dizziness begin?"     4 days ago 6. AGGRAVATING FACTORS: "Does anything make it worse?" (e.g., standing, change in head position)     Standing, change in head position, bending over  7. CAUSE: "What do you think is causing the dizziness?"     Pt is unsure 8. RECURRENT SYMPTOM: "Have you had dizziness before?" If so, ask: "When was the last time?" "What happened that time?"     No  9. OTHER SYMPTOMS: "Do you have any other symptoms?" (e.g., headache, weakness, numbness, vomiting, earache)     No headache, no increase in weakness, no numbness vomiting or earache 10. PREGNANCY: "Is there any chance you are pregnant?" "When was your last menstrual period?"       n/a  Protocols used:  DIZZINESS - VERTIGO-A-AH

## 2017-05-07 ENCOUNTER — Other Ambulatory Visit: Payer: Self-pay

## 2017-05-07 ENCOUNTER — Ambulatory Visit: Payer: Self-pay | Admitting: Family Medicine

## 2017-05-07 ENCOUNTER — Encounter: Payer: Self-pay | Admitting: Family Medicine

## 2017-05-07 VITALS — BP 143/96 | HR 78 | Temp 98.8°F | Resp 16 | Ht 74.0 in | Wt 244.4 lb

## 2017-05-07 DIAGNOSIS — Z79899 Other long term (current) drug therapy: Secondary | ICD-10-CM

## 2017-05-07 DIAGNOSIS — R6882 Decreased libido: Secondary | ICD-10-CM

## 2017-05-07 DIAGNOSIS — R42 Dizziness and giddiness: Secondary | ICD-10-CM

## 2017-05-07 DIAGNOSIS — I10 Essential (primary) hypertension: Secondary | ICD-10-CM

## 2017-05-07 DIAGNOSIS — F900 Attention-deficit hyperactivity disorder, predominantly inattentive type: Secondary | ICD-10-CM

## 2017-05-07 DIAGNOSIS — E039 Hypothyroidism, unspecified: Secondary | ICD-10-CM

## 2017-05-07 LAB — POCT CBC
Granulocyte percent: 58.4 %G (ref 37–80)
HCT, POC: 46.9 % (ref 43.5–53.7)
Hemoglobin: 15.9 g/dL (ref 14.1–18.1)
Lymph, poc: 1.7 (ref 0.6–3.4)
MCH, POC: 30.3 pg (ref 27–31.2)
MCHC: 33.9 g/dL (ref 31.8–35.4)
MCV: 89.5 fL (ref 80–97)
MID (CBC): 0.5 (ref 0–0.9)
MPV: 7.5 fL (ref 0–99.8)
POC Granulocyte: 3.1 (ref 2–6.9)
POC LYMPH %: 33 % (ref 10–50)
POC MID %: 8.6 % (ref 0–12)
Platelet Count, POC: 258 10*3/uL (ref 142–424)
RBC: 5.24 M/uL (ref 4.69–6.13)
RDW, POC: 12.9 %
WBC: 5.3 10*3/uL (ref 4.6–10.2)

## 2017-05-07 LAB — POCT URINALYSIS DIP (MANUAL ENTRY)
BILIRUBIN UA: NEGATIVE
Blood, UA: NEGATIVE
GLUCOSE UA: NEGATIVE mg/dL
Ketones, POC UA: NEGATIVE mg/dL
Leukocytes, UA: NEGATIVE
NITRITE UA: NEGATIVE
Protein Ur, POC: NEGATIVE mg/dL
Spec Grav, UA: 1.025 (ref 1.010–1.025)
Urobilinogen, UA: 0.2 E.U./dL
pH, UA: 5.5 (ref 5.0–8.0)

## 2017-05-07 MED ORDER — MECLIZINE HCL 25 MG PO TABS: 25.0000 mg | ORAL_TABLET | Freq: Three times a day (TID) | ORAL | 1 refills | Status: DC | PRN

## 2017-05-07 MED ORDER — AMLODIPINE BESYLATE 5 MG PO TABS: 5.0000 mg | ORAL_TABLET | Freq: Every day | ORAL | 5 refills | Status: DC

## 2017-05-07 MED ORDER — LEVOTHYROXINE SODIUM 112 MCG PO TABS: 112.0000 ug | ORAL_TABLET | Freq: Every day | ORAL | 1 refills | Status: DC

## 2017-05-07 NOTE — Progress Notes (Signed)
Chief Complaint  Patient presents with  . New Patient (Initial Visit)    establish care/vertigo x 4 days, started while trying to get out of bed and had to grab door to keep from falling, vertigo intermittent but when lying down at night the head is spinning, no vertigo last night but mild vertigo this morning.  Pt has had hip surgery on both hips, per pt he has to use cane due to dizziness.    HPI  Positional Dizziness Pt reports that 4-5 days ago he started having vertigo States that he first got out of bed and had to grab the door to keep from fallings Onset was rapid Stood there for a minute and it resolved  That evening he went to bed and when his head hit the pillow the room started spinning 24 hours ago he felt like his symptoms improved in the morning but the sense of dizziness remained  He reports that today he had only a reduced sense of dizziness this morning In the shower he closed his eyes and noted that this started to happen again  He denies nausea or vomiting, no heart palpitations, no sweating, no facial pain or facial droop, no slurred speech He is a nonsmoker He denies any congestion or sinus fullness He denies seasonal allergies   Hypothyroid- he stopped his thyroid medication due to loss of insurance He has not had any thyroid monitoring for more than a year He states that he has a history of thyroid nodules He has not been on any thyroid medication He states that he lost his insurance so he did not continue the medication He has been gaining weight, sluggish, low energy, apathy, dry skin, low libido No constipation, no hair loss  Depression screen PHQ 2/9 05/07/2017  Decreased Interest 0  Down, Depressed, Hopeless 0  PHQ - 2 Score 0    Hypertension: Patient here for follow-up of elevated blood pressure. He is not exercising and is adherent to low salt diet.  Blood pressure is not checked at home but was previously well controlled at home. Cardiac symptoms  none. Patient denies chest pain, chest pressure/discomfort, exertional chest pressure/discomfort, fatigue, irregular heart beat, lower extremity edema and near-syncope.  Cardiovascular risk factors: hypertension and male gender. Use of agents associated with hypertension: thyroid hormones. History of target organ damage: none. BP Readings from Last 3 Encounters:  05/07/17 (!) 143/96  11/03/14 107/67  10/20/14 113/75     Past Medical History:  Diagnosis Date  . Avascular necrosis of bone of left hip (HCC) 10/31/2014  . Constipation due to pain medication   . Enlarged prostate   . Hypertension   . Hypothyroidism   . Primary localized osteoarthritis of right hip 09/19/2014    Current Outpatient Medications  Medication Sig Dispense Refill  . amLODipine (NORVASC) 5 MG tablet Take 1 tablet (5 mg total) by mouth daily. 30 tablet 5  . levothyroxine (SYNTHROID, LEVOTHROID) 112 MCG tablet Take 1 tablet (112 mcg total) by mouth daily before breakfast. Take one hour before breakfast. Drink plenty of fluids to wash pill down. 30 tablet 1  . meclizine (ANTIVERT) 25 MG tablet Take 1 tablet (25 mg total) by mouth 3 (three) times daily as needed for dizziness. 30 tablet 1  . OVER THE COUNTER MEDICATION Take 1 tablet by mouth as needed (constipation). OTC laxative     No current facility-administered medications for this visit.     Allergies:  Allergies  Allergen Reactions  . Codeine  Itching  . Other Other (See Comments)    Metal Nickel   Skin becomes red with itching and weeping      Past Surgical History:  Procedure Laterality Date  . arthroscopic  knee Left   . COLONOSCOPY    . TONSILLECTOMY    . TOTAL HIP ARTHROPLASTY Right 09/19/2014  . TOTAL HIP ARTHROPLASTY Right 09/19/2014   Procedure: TOTAL HIP ARTHROPLASTY;  Surgeon: Teryl Lucy, MD;  Location: [facility] OR;  Service: Orthopedics;  Laterality: Right;  . TOTAL HIP ARTHROPLASTY Left 10/31/2014   Procedure: TOTAL HIP ARTHROPLASTY;  Surgeon:  Teryl Lucy, MD;  Location: [facility] OR;  Service: Orthopedics;  Laterality: Left;    Social History   Socioeconomic History  . Marital status: Legally Separated    Spouse name: None  . Number of children: None  . Years of education: None  . Highest education level: None  Social Needs  . Financial resource strain: None  . Food insecurity - worry: None  . Food insecurity - inability: None  . Transportation needs - medical: None  . Transportation needs - non-medical: None  Occupational History  . None  Tobacco Use  . Smoking status: Never Smoker  . Smokeless tobacco: Never Used  Substance and Sexual Activity  . Alcohol use: Yes    Alcohol/week: 1.2 oz    Types: 1 Glasses of wine, 1 Cans of beer per week  . Drug use: Yes    Types: Marijuana    Comment: many years ago  . Sexual activity: None  Other Topics Concern  . None  Social History Narrative  . None    History reviewed. No pertinent family history.   Review of Systems  Constitutional: Negative for chills, fever and weight loss.  Cardiovascular: Negative for chest pain, palpitations, orthopnea and claudication.  Gastrointestinal: Negative for abdominal pain, constipation, diarrhea, nausea and vomiting.  Skin: Negative for itching and rash.  Neurological: Positive for dizziness. Negative for tingling, tremors, sensory change, speech change and headaches.  Psychiatric/Behavioral: Negative for depression. The patient has insomnia. The patient is not nervous/anxious.    See hpi    Objective: Vitals:   05/07/17 0819  BP: (!) 143/96  Pulse: 78  Resp: 16  Temp: 98.8 F (37.1 C)  TempSrc: Oral  SpO2: 97%  Weight: 244 lb 6.4 oz (110.9 kg)  Height: 6\' 2"  (1.88 m)    Physical Exam  Constitutional: He is oriented to person, place, and time. He appears well-developed and well-nourished.  HENT:  Head: Normocephalic and atraumatic.  Right Ear: External ear normal.  Left Ear: External ear normal.  Nose: Nose normal.   Mouth/Throat: Oropharynx is clear and moist.  Eyes: Conjunctivae and EOM are normal.  Neck: Normal range of motion. No thyromegaly present.  Cardiovascular: Normal rate, regular rhythm and normal heart sounds.  No murmur heard. Pulmonary/Chest: Effort normal and breath sounds normal. No stridor. No respiratory distress. He has no wheezes.  Abdominal: Soft. Bowel sounds are normal. He exhibits no distension. There is no tenderness.  Neurological: He is alert and oriented to person, place, and time. He displays normal reflexes. No cranial nerve deficit or sensory deficit. He exhibits normal muscle tone. Coordination normal.  Skin: Skin is warm. Capillary refill takes less than 2 seconds.  Psychiatric: He has a normal mood and affect. His behavior is normal. Judgment and thought content normal.    Assessment and Plan Jerico was seen today for new patient (initial visit).  Diagnoses and all orders for  this visit:  Vertigo- seems like BPPV Improved here today without symptoms with positional changes Gave antivert should symptoms recur -     Comprehensive metabolic panel -     TSH -     POCT CBC -     POCT urinalysis dipstick  Essential hypertension- given that bp is doing well off meds for a year will only restart amlodipine and follow up -     Comprehensive metabolic panel -     Discontinue: amLODipine (NORVASC) 5 MG tablet; Take 1 tablet (5 mg total) by mouth daily. -     amLODipine (NORVASC) 5 MG tablet; Take 1 tablet (5 mg total) by mouth daily.  Acquired hypothyroidism- will start weight based thyroid  Pt was on bid a year ago Will use 110kg as weight and restart at daily With plan to increase incrementally to goal of tsh 1-5 Return in 2 months for tsh -     Comprehensive metabolic panel -     TSH -     levothyroxine (SYNTHROID, LEVOTHROID) 112 MCG tablet; Take 1 tablet (112 mcg total) by mouth daily before breakfast. Take one hour before breakfast. Drink plenty of  fluids to wash pill down. -     TSH; Future  Attention deficit hyperactivity disorder (ADHD), predominantly inattentive type- discussed that we cannot address this today but thyroid that is not managed can worsen this condition  Low libido- discussed that supplementing his testosterone should not be initiated until his thyroid function is optimized  Encounter for medication management -     Discontinue: amLODipine (NORVASC) 5 MG tablet; Take 1 tablet (5 mg total) by mouth daily. -     amLODipine (NORVASC) 5 MG tablet; Take 1 tablet (5 mg total) by mouth daily.  Other orders -     Cancel: Tdap vaccine greater than or equal to 7yo IM -     Cancel: Flu Vaccine QUAD 36+ mos IM -     Cancel: Ambulatory referral to Gastroenterology -     meclizine (ANTIVERT) 25 MG tablet; Take 1 tablet (25 mg total) by mouth 3 (three) times daily as needed for dizziness.     Taino Maertens A Lejuan Botto

## 2017-05-07 NOTE — Patient Instructions (Addendum)
Return to clinic for thyroid recheck in 2 months.     IF you received an x-ray today, you will receive an invoice from [facility]. Please contact [facility] at [phone] with questions or concerns regarding your invoice.   IF you received labwork today, you will receive an invoice from [place]. Please contact [facility] at [phone] with questions or concerns regarding your invoice.   Our billing staff will not be able to assist you with questions regarding bills from these companies.  You will be contacted with the lab results as soon as they are available. The fastest way to get your results is to activate your My Chart account. Instructions are located on the last page of this paperwork. If you have not heard from [place] regarding the results in 2 weeks, please contact this office.    Vertigo Vertigo is the feeling that you or your surroundings are moving when they are not. Vertigo can be dangerous if it occurs while you are doing something that could endanger you or others, such as driving. What are the causes? This condition is caused by a disturbance in the signals that are sent by your body's sensory systems to your brain. Different causes of a disturbance can lead to vertigo, including:  Infections, especially in the inner ear.  A bad reaction to a drug, or misuse of alcohol and medicines.  Withdrawal from drugs or alcohol.  Quickly changing positions, as when lying down or rolling over in bed.  Migraine headaches.  Decreased blood flow to the brain.  Decreased blood pressure.  Increased pressure in the brain from a head or neck injury, stroke, infection, tumor, or bleeding.  Central nervous system disorders.  What are the signs or symptoms? Symptoms of this condition usually occur when you move your head or your eyes in different directions. Symptoms may start suddenly, and they usually last for less than a minute. Symptoms may include:  Loss of  balance and falling.  Feeling like you are spinning or moving.  Feeling like your surroundings are spinning or moving.  Nausea and vomiting.  Blurred vision or double vision.  Difficulty hearing.  Slurred speech.  Dizziness.  Involuntary eye movement (nystagmus).  Symptoms can be mild and cause only slight annoyance, or they can be severe and interfere with daily life. Episodes of vertigo may return (recur) over time, and they are often triggered by certain movements. Symptoms may improve over time. How is this diagnosed? This condition may be diagnosed based on medical history and the quality of your nystagmus. Your health care provider may test your eye movements by asking you to quickly change positions to trigger the nystagmus. This may be called the Dix-Hallpike test, head thrust test, or roll test. You may be referred to a health care provider who specializes in ear, nose, and throat (ENT) problems (otolaryngologist) or a provider who specializes in disorders of the central nervous system (neurologist). You may have additional testing, including:  A physical exam.  Blood tests.  MRI.  A CT scan.  An electrocardiogram (ECG). This records electrical activity in your heart.  An electroencephalogram (EEG). This records electrical activity in your brain.  Hearing tests.  How is this treated? Treatment for this condition depends on the cause and the severity of the symptoms. Treatment options include:  Medicines to treat nausea or vertigo. These are usually used for severe cases. Some medicines that are used to treat other conditions may also reduce or eliminate vertigo symptoms. These  include: ? Medicines that control allergies (antihistamines). ? Medicines that control seizures (anticonvulsants). ? Medicines that relieve depression (antidepressants). ? Medicines that relieve anxiety (sedatives).  Head movements to adjust your inner ear back to normal. If your vertigo  is caused by an ear problem, your health care provider may recommend certain movements to correct the problem.  Surgery. This is rare.  Follow these instructions at home: Safety  Move slowly.Avoid sudden body or head movements.  Avoid driving.  Avoid operating heavy machinery.  Avoid doing any tasks that would cause danger to you or others if you would have a vertigo episode during the task.  If you have trouble walking or keeping your balance, try using a cane for stability. If you feel dizzy or unstable, sit down right away.  Return to your normal activities as told by your health care provider. Ask your health care provider what activities are safe for you. General instructions  Take over-the-counter and prescription medicines only as told by your health care provider.  Avoid certain positions or movements as told by your health care provider.  Drink enough fluid to keep your urine clear or pale yellow.  Keep all follow-up visits as told by your health care provider. This is important. Contact a health care provider if:  Your medicines do not relieve your vertigo or they make it worse.  You have a fever.  Your condition gets worse or you develop new symptoms.  Your family or friends notice any behavioral changes.  Your nausea or vomiting gets worse.  You have numbness or a "pins and needles" sensation in part of your body. Get help right away if:  You have difficulty moving or speaking.  You are always dizzy.  You faint.  You develop severe headaches.  You have weakness in your hands, arms, or legs.  You have changes in your hearing or vision.  You develop a stiff neck.  You develop sensitivity to light. This information is not intended to replace advice given to you by your health care provider. Make sure you discuss any questions you have with your health care provider. Document Released: 01/01/2005 Document Revised: 09/05/2015 Document Reviewed:  07/17/2014 Elsevier Interactive Patient Education  [employer]2018 Elsevier Inc.

## 2017-05-08 LAB — COMPREHENSIVE METABOLIC PANEL
ALK PHOS: 79 IU/L (ref 39–117)
ALT: 31 IU/L (ref 0–44)
AST: 27 IU/L (ref 0–40)
Albumin/Globulin Ratio: 1.9 (ref 1.2–2.2)
Albumin: 4.7 g/dL (ref 3.6–4.8)
BUN/Creatinine Ratio: 18 (ref 10–24)
BUN: 18 mg/dL (ref 8–27)
Bilirubin Total: 0.3 mg/dL (ref 0.0–1.2)
CHLORIDE: 108 mmol/L — AB (ref 96–106)
CO2: 19 mmol/L — AB (ref 20–29)
CREATININE: 1.02 mg/dL (ref 0.76–1.27)
Calcium: 10 mg/dL (ref 8.6–10.2)
GFR calc Af Amer: 91 mL/min/{1.73_m2} (ref >59)
GFR calc non Af Amer: 78 mL/min/{1.73_m2} (ref >59)
GLOBULIN, TOTAL: 2.5 g/dL (ref 1.5–4.5)
Glucose: 111 mg/dL — ABNORMAL HIGH (ref 65–99)
POTASSIUM: 4.7 mmol/L (ref 3.5–5.2)
SODIUM: 143 mmol/L (ref 134–144)
Total Protein: 7.2 g/dL (ref 6.0–8.5)

## 2017-05-08 LAB — TSH: TSH: 3.77 u[IU]/mL (ref 0.450–4.500)

## 2017-07-01 ENCOUNTER — Other Ambulatory Visit: Payer: Self-pay | Admitting: Family Medicine

## 2017-07-01 DIAGNOSIS — E039 Hypothyroidism, unspecified: Secondary | ICD-10-CM

## 2017-07-01 NOTE — Telephone Encounter (Signed)
Pt needs to schedule office visit for additional refills.

## 2017-07-01 NOTE — Telephone Encounter (Signed)
Copied from CRM [id number]. Topic: Quick Communication - Rx Refill/Question >> Jul 01, 2017  9:30 AM Floria RavelingStovall, Shana A wrote: Medication: levothyroxine (SYNTHROID, LEVOTHROID) 112 MCG tablet [[id number]][[id number]]   Has the patient contacted their pharmacy? (Agent: If no, request that the patient contact the pharmacy for the refill.) Preferred Pharmacy (with phone number or street name): pt would like the next step up on the dose of this med. He would like to know if she would increase it.  Pharmacy -[facility] on file  Agent: Please be advised that RX refills may take up to 3 business days. We ask that you follow-up with your pharmacy.

## 2017-07-02 ENCOUNTER — Telehealth: Payer: Self-pay | Admitting: Family Medicine

## 2017-07-02 ENCOUNTER — Ambulatory Visit (INDEPENDENT_AMBULATORY_CARE_PROVIDER_SITE_OTHER): Payer: Self-pay | Admitting: Family Medicine

## 2017-07-02 DIAGNOSIS — E039 Hypothyroidism, unspecified: Secondary | ICD-10-CM

## 2017-07-02 NOTE — Telephone Encounter (Signed)
Med refill req - Levothyroxine and advising pt coming in for TSH labs [facility]

## 2017-07-02 NOTE — Telephone Encounter (Signed)
Patient called, left detailed VM that he will need to schedule a lab appointment for TSH before refilling Levothyroxine. I advised office appointment needed, but according to Dr. [facility] note, he only needs a TSH Check.

## 2017-07-02 NOTE — Telephone Encounter (Signed)
Pt coming in this afternoon for TSH labs

## 2017-07-02 NOTE — Telephone Encounter (Signed)
Pt was seen to get labs drawn on 07/02/2017. He states that he is sluggish and is wanting a higher dose of his thyroid medication. He has a couple pills left and didn't know if he needed to come in for a visit. He states that he is fine to get his thyroid medication at the same dosage to last him until he can be seen.

## 2017-07-02 NOTE — Progress Notes (Signed)
Lab Only Visit 

## 2017-07-03 LAB — TSH: TSH: 2.24 u[IU]/mL (ref 0.450–4.500)

## 2017-07-03 NOTE — Telephone Encounter (Signed)
Please advise 

## 2017-07-04 MED ORDER — LEVOTHYROXINE SODIUM 112 MCG PO TABS: ORAL_TABLET | ORAL | 1 refills | Status: DC

## 2017-07-04 NOTE — Telephone Encounter (Signed)
Notified pt that his thyroid was in range.  Refilled for 90 day supply with one refill   Discussed that if he has fatigue to come in for appt for more detailed work up including testosterone, vitamin D, b12, ferritin and screening for diabetes.   He will call back for appt.

## 2017-08-05 ENCOUNTER — Ambulatory Visit: Payer: Self-pay | Admitting: Family Medicine

## 2017-08-10 ENCOUNTER — Ambulatory Visit (INDEPENDENT_AMBULATORY_CARE_PROVIDER_SITE_OTHER): Payer: Self-pay | Admitting: Family Medicine

## 2017-08-10 ENCOUNTER — Encounter: Payer: Self-pay | Admitting: Family Medicine

## 2017-08-10 ENCOUNTER — Other Ambulatory Visit: Payer: Self-pay

## 2017-08-10 VITALS — BP 136/95 | HR 70 | Temp 99.2°F | Resp 16 | Ht 74.0 in | Wt 245.6 lb

## 2017-08-10 DIAGNOSIS — Z6831 Body mass index (BMI) 31.0-31.9, adult: Secondary | ICD-10-CM

## 2017-08-10 DIAGNOSIS — E6609 Other obesity due to excess calories: Secondary | ICD-10-CM

## 2017-08-10 DIAGNOSIS — E039 Hypothyroidism, unspecified: Secondary | ICD-10-CM

## 2017-08-10 DIAGNOSIS — I10 Essential (primary) hypertension: Secondary | ICD-10-CM

## 2017-08-10 DIAGNOSIS — R5383 Other fatigue: Secondary | ICD-10-CM

## 2017-08-10 DIAGNOSIS — Z131 Encounter for screening for diabetes mellitus: Secondary | ICD-10-CM

## 2017-08-10 DIAGNOSIS — Z13220 Encounter for screening for lipoid disorders: Secondary | ICD-10-CM

## 2017-08-10 MED ORDER — LEVOTHYROXINE SODIUM 112 MCG PO TABS: ORAL_TABLET | ORAL | 1 refills | Status: DC

## 2017-08-10 NOTE — Patient Instructions (Addendum)
     IF you received an x-ray today, you will receive an invoice from [facility]. Please contact [facility] at [phone] with questions or concerns regarding your invoice.   IF you received labwork today, you will receive an invoice from [facility]. Please contact [facility] at [phone] with questions or concerns regarding your invoice.   Our billing staff will not be able to assist you with questions regarding bills from these companies.  You will be contacted with the lab results as soon as they are available. The fastest way to get your results is to activate your My Chart account. Instructions are located on the last page of this paperwork. If you have not heard from us regarding the results in 2 weeks, please contact this office.      Hypothyroidism Hypothyroidism is a disorder of the thyroid. The thyroid is a large gland that is located in the lower front of the neck. The thyroid releases hormones that control how the body works. With hypothyroidism, the thyroid does not make enough of these hormones. What are the causes? Causes of hypothyroidism may include:  Viral infections.  Pregnancy.  Your own defense system (immune system) attacking your thyroid.  Certain medicines.  Birth defects.  Past radiation treatments to your head or neck.  Past treatment with radioactive iodine.  Past surgical removal of part or all of your thyroid.  Problems with the gland that is located in the center of your brain (pituitary).  What are the signs or symptoms? Signs and symptoms of hypothyroidism may include:  Feeling as though you have no energy (lethargy).  Inability to tolerate cold.  Weight gain that is not explained by a change in diet or exercise habits.  Dry skin.  Coarse hair.  Menstrual irregularity.  Slowing of thought processes.  Constipation.  Sadness or depression.  How is this diagnosed? Your health care provider may diagnose  hypothyroidism with blood tests and ultrasound tests. How is this treated? Hypothyroidism is treated with medicine that replaces the hormones that your body does not make. After you begin treatment, it may take several weeks for symptoms to go away. Follow these instructions at home:  Take medicines only as directed by your health care provider.  If you start taking any new medicines, tell your health care provider.  Keep all follow-up visits as directed by your health care provider. This is important. As your condition improves, your dosage needs may change. You will need to have blood tests regularly so that your health care provider can watch your condition. Contact a health care provider if:  Your symptoms do not get better with treatment.  You are taking thyroid replacement medicine and: ? You sweat excessively. ? You have tremors. ? You feel anxious. ? You lose weight rapidly. ? You cannot tolerate heat. ? You have emotional swings. ? You have diarrhea. ? You feel weak. Get help right away if:  You develop chest pain.  You develop an irregular heartbeat.  You develop a rapid heartbeat. This information is not intended to replace advice given to you by your health care provider. Make sure you discuss any questions you have with your health care provider. Document Released: 03/24/2005 Document Revised: 08/30/2015 Document Reviewed: 08/09/2013 Elsevier Interactive Patient Education  2018 Elsevier Inc.  

## 2017-08-10 NOTE — Progress Notes (Signed)
Chief Complaint  Patient presents with  . Thyroid Problem    3 month f/u.  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 Last fill of stimulant for ADD was 10/23/2015 for adderall    Lab Results  Component Value Date   TSH 2.240 07/02/2017    Body mass index is 31.53 kg/m.  No constipation or diarrhea Denies insomnia  Reports that he had some skin improvement since taking the thyroid medication   Hypertension: Patient here for follow-up of elevated blood pressure. He is not exercising and is adherent to low salt diet.  Blood pressure is well controlled at home. Cardiac symptoms none. Patient denies chest pain, chest pressure/discomfort, claudication, dyspnea, exertional chest pressure/discomfort, fatigue, irregular heart beat, lower extremity edema and near-syncope.  Cardiovascular risk factors: hypertension, male gender, obesity (BMI >= 30 kg/m2) and sedentary lifestyle. Use of agents associated with hypertension: none. History of target organ damage: none. BP Readings from Last 3 Encounters:  08/10/17 (!) 136/95  05/07/17 (!) 143/96  11/03/14 107/67    Low testosterone Reports that he was taking steroids when he was in his 40s He would run 6 miles six days a week He thinks that he has been having low testosterone since that time He reports that he has not been on testosterone androgel since 2017 He takes  of DHEA daily  Past Medical History:  Diagnosis Date  . Avascular necrosis of bone of left hip (HCC) 10/31/2014  . Constipation due to pain medication   .  Enlarged prostate   . Hypertension   . Hypothyroidism   . Primary localized osteoarthritis of right hip 09/19/2014    Current Outpatient Medications  Medication Sig Dispense Refill  . amLODipine (NORVASC) 5 MG tablet Take 1 tablet (5 mg total) by mouth daily. 30 tablet 5  . levothyroxine (SYNTHROID, LEVOTHROID) 112 MCG tablet TAKE 1 TABLET BY MOUTH ONE HOUR BEFORE BREAKFAST. DRINK PLENTY OF FLUIDS TO WASH PILL DOWN 90 tablet 1  . OVER THE COUNTER MEDICATION Take 1 tablet by mouth as needed (constipation). OTC laxative     No current facility-administered medications for this visit.     Allergies:  Allergies  Allergen Reactions  . Codeine Itching  . Other Other (See Comments)    Metal Nickel   Skin becomes red with itching and weeping      Past Surgical History:  Procedure Laterality Date  . arthroscopic  knee Left   . COLONOSCOPY    . TONSILLECTOMY    . TOTAL HIP ARTHROPLASTY Right 09/19/2014  . TOTAL HIP ARTHROPLASTY Right 09/19/2014   Procedure: TOTAL HIP ARTHROPLASTY;  Surgeon: Teryl Lucy, MD;  Location: [facility] OR;  Service: Orthopedics;  Laterality: Right;  . TOTAL HIP ARTHROPLASTY Left 10/31/2014   Procedure: TOTAL HIP ARTHROPLASTY;  Surgeon: Teryl Lucy, MD;  Location: [facility] OR;  Service: Orthopedics;  Laterality: Left;    Social History   Socioeconomic History  . Marital status: Legally Separated    Spouse name: Not on file  . Number of children:  Not on file  . Years of education: Not on file  . Highest education level: Not on file  Occupational History  . Not on file  Social Needs  . Financial resource strain: Not on file  . Food insecurity:    Worry: Not on file    Inability: Not on file  . Transportation needs:    Medical: Not on file    Non-medical: Not on file  Tobacco Use  . Smoking status: Never Smoker  . Smokeless tobacco: Never Used  Substance and Sexual Activity  . Alcohol use: Yes    Alcohol/week: 1.2 oz    Types: 1 Glasses of wine, 1 Cans of  beer per week  . Drug use: Yes    Types: Marijuana    Comment: many years ago  . Sexual activity: Not on file  Lifestyle  . Physical activity:    Days per week: Not on file    Minutes per session: Not on file  . Stress: Not on file  Relationships  . Social connections:    Talks on phone: Not on file    Gets together: Not on file    Attends religious service: Not on file    Active member of club or organization: Not on file    Attends meetings of clubs or organizations: Not on file    Relationship status: Not on file  Other Topics Concern  . Not on file  Social History Narrative  . Not on file    No family history on file.   ROS Review of Systems See HPI Constitution: No fevers or chills No malaise No diaphoresis Skin: No rash or itching Eyes: no blurry vision, no double vision GU: no dysuria or hematuria Neuro: no dizziness or headaches * all others reviewed and negative   Objective: Vitals:   08/10/17 1026  BP: (!) 136/95  Pulse: 70  Resp: 16  Temp: 99.2 F (37.3 C)  TempSrc: Oral  SpO2: 93%  Weight: 245 lb 9.6 oz (111.4 kg)  Height:  (1.88 m)    Physical Exam  Constitutional: He is oriented to person, place, and time. He appears well-developed and well-nourished.  HENT:  Head: Normocephalic and atraumatic.  Eyes: Conjunctivae and EOM are normal.  Cardiovascular: Normal rate, regular rhythm and normal heart sounds.  No murmur heard. Pulmonary/Chest: Effort normal and breath sounds normal. No stridor. No respiratory distress. He has no wheezes.  Neurological: He is alert and oriented to person, place, and time.  Skin: Skin is warm. Capillary refill takes less than 2 seconds.  Psychiatric: He has a normal mood and affect. His behavior is normal. Judgment and thought content normal.    Assessment and Plan Rai was seen today for thyroid problem.  Diagnoses and all orders for this visit:  Acquired hypothyroidism- continue current dose Will  check testosterone -     TestT+TestF+SHBG  Class 1 obesity due to excess calories with serious comorbidity and body mass index (BMI) of 31.0 to 31.9 in adult-  Discussed weight watchers, taking fitness classes and joining a group Pt frustrated with his difficulty maintaining weight compared to when he was younger -     Hemoglobin A1c -     TestT+TestF+SHBG  Screening, lipid -     Lipid panel  Screening for diabetes mellitus -     Hemoglobin A1c  Essential hypertension- bp in good range  Low energy- will check levels because low T is linked to low focus and energy -  TestT+TestF+SHBG     Nissan Frazzini A Creta Levin

## 2017-08-12 LAB — LIPID PANEL
Chol/HDL Ratio: 5.8 ratio — ABNORMAL HIGH (ref 0.0–5.0)
Cholesterol, Total: 231 mg/dL — ABNORMAL HIGH (ref 100–199)
HDL: 40 mg/dL (ref >39)
LDL Calculated: 157 mg/dL — ABNORMAL HIGH (ref 0–99)
Triglycerides: 172 mg/dL — ABNORMAL HIGH (ref 0–149)
VLDL CHOLESTEROL CAL: 34 mg/dL (ref 5–40)

## 2017-08-12 LAB — TESTT+TESTF+SHBG
SEX HORMONE BINDING: 22.7 nmol/L (ref 19.3–76.4)
TESTOSTERONE FREE: 6.7 pg/mL (ref 6.6–18.1)
Testosterone, total: 107.5 ng/dL — ABNORMAL LOW (ref 264.0–916.0)

## 2017-08-12 LAB — HEMOGLOBIN A1C
ESTIMATED AVERAGE GLUCOSE: 123 mg/dL
Hgb A1c MFr Bld: 5.9 % — ABNORMAL HIGH (ref 4.8–5.6)

## 2017-11-16 ENCOUNTER — Ambulatory Visit: Payer: Self-pay | Admitting: Family Medicine

## 2017-12-09 ENCOUNTER — Other Ambulatory Visit: Payer: Self-pay | Admitting: Family Medicine

## 2017-12-09 DIAGNOSIS — Z79899 Other long term (current) drug therapy: Secondary | ICD-10-CM

## 2017-12-09 DIAGNOSIS — I10 Essential (primary) hypertension: Secondary | ICD-10-CM

## 2018-04-04 ENCOUNTER — Other Ambulatory Visit: Payer: Self-pay | Admitting: Family Medicine

## 2018-04-04 DIAGNOSIS — E039 Hypothyroidism, unspecified: Secondary | ICD-10-CM

## 2018-04-04 DIAGNOSIS — I10 Essential (primary) hypertension: Secondary | ICD-10-CM

## 2018-04-04 DIAGNOSIS — Z79899 Other long term (current) drug therapy: Secondary | ICD-10-CM

## 2018-05-05 ENCOUNTER — Other Ambulatory Visit: Payer: Self-pay | Admitting: Family Medicine

## 2018-05-05 ENCOUNTER — Other Ambulatory Visit: Payer: Self-pay

## 2018-05-05 DIAGNOSIS — Z79899 Other long term (current) drug therapy: Secondary | ICD-10-CM

## 2018-05-05 DIAGNOSIS — I10 Essential (primary) hypertension: Secondary | ICD-10-CM

## 2018-05-05 MED ORDER — AMLODIPINE BESYLATE 5 MG PO TABS: 5.0000 mg | ORAL_TABLET | Freq: Every day | ORAL | 1 refills | Status: DC

## 2018-05-05 NOTE — Telephone Encounter (Signed)
Requested medication (s) are due for refill today -yes  Requested medication (s) are on the active medication list -yes  Future visit scheduled -no  Last refill: 12/09/17  Notes to clinic: Patient lat request was denied- patient has not scheduled appointment for BP follow up- notes indicate messages for refill. Attempted to call patient today- left message to call office to scheduled appointment  Requested Prescriptions  Pending Prescriptions Disp Refills   amLODipine (NORVASC) 5 MG tablet [Pharmacy Med Name: amLODIPine Besylate 5 MG Oral Tablet] 60 tablet 0    Sig: TAKE 1 TABLET BY MOUTH ONCE DAILY     Cardiovascular:  Calcium Channel Blockers Failed - 05/05/2018 11:45 AM      Failed - Last BP in normal range    BP Readings from Last 1 Encounters:  08/10/17 (!) 136/95         Failed - Valid encounter within last 6 months    Recent Outpatient Visits          8 months ago Acquired hypothyroidism   Primary Care at [facility], Manus Rudd, MD   10 months ago Acquired hypothyroidism   Primary Care at [facility], Manus Rudd, MD   12 months ago Vertigo   Primary Care at [facility], [place], MD              Requested Prescriptions  Pending Prescriptions Disp Refills   amLODipine (NORVASC) 5 MG tablet [Pharmacy Med Name: amLODIPine Besylate 5 MG Oral Tablet] 60 tablet 0    Sig: TAKE 1 TABLET BY MOUTH ONCE DAILY     Cardiovascular:  Calcium Channel Blockers Failed - 05/05/2018 11:45 AM      Failed - Last BP in normal range    BP Readings from Last 1 Encounters:  08/10/17 (!) 136/95         Failed - Valid encounter within last 6 months    Recent Outpatient Visits          8 months ago Acquired hypothyroidism   Primary Care at [facility], Manus Rudd, MD   10 months ago Acquired hypothyroidism   Primary Care at [facility], Manus Rudd, MD   12 months ago Vertigo   Primary Care at [facility], Manus Rudd, MD

## 2018-05-05 NOTE — Telephone Encounter (Signed)
Patient returned call and has scheduled appointment for 07/08/18 with PCP- can he get refill of BP medication to last until that appointment?

## 2018-06-14 ENCOUNTER — Other Ambulatory Visit: Payer: Self-pay | Admitting: Family Medicine

## 2018-06-14 DIAGNOSIS — I10 Essential (primary) hypertension: Secondary | ICD-10-CM

## 2018-06-14 DIAGNOSIS — E039 Hypothyroidism, unspecified: Secondary | ICD-10-CM

## 2018-06-14 DIAGNOSIS — Z79899 Other long term (current) drug therapy: Secondary | ICD-10-CM

## 2018-06-14 NOTE — Telephone Encounter (Signed)
Copied from CRM #[id number]. Topic: Quick Communication - Rx Refill/Question >> Jun 14, 2018  4:01 PM Maia Petties wrote: Medication: amLODipine (NORVASC) 5 MG tablet & levothyroxine (SYNTHROID, LEVOTHROID) 112 MCG tablet - pt has about 1-2 weeks left on each Pt called asking for additional refills on medications and hoping to cancel appt 07/08/2018 due to the possibility this time will be the height of the epidemic (Coronavirus). He does not want to come in for a well check with so much illness spreading. Possibly send in a 90 day supply so pt can reschedule when things have settled. Please advise.   Ok to leave message   Has the patient contacted their pharmacy? Yes - no refills remaining - advised to call his MD Preferred Pharmacy (with phone number or street name): [facility] - [place], [place] - [address] [phone] (Phone) [phone] (Fax)

## 2018-06-15 MED ORDER — LEVOTHYROXINE SODIUM 112 MCG PO TABS: ORAL_TABLET | ORAL | 0 refills | Status: DC

## 2018-06-15 MED ORDER — AMLODIPINE BESYLATE 5 MG PO TABS
5.0000 mg | ORAL_TABLET | Freq: Every day | ORAL | 0 refills | Status: DC
Start: 2018-06-15 — End: 2018-07-12

## 2018-06-15 NOTE — Telephone Encounter (Signed)
Requested Prescriptions  Pending Prescriptions Disp Refills  . amLODipine (NORVASC) 5 MG tablet 30 tablet 0    Sig: Take 1 tablet (5 mg total) by mouth daily.     Cardiovascular:  Calcium Channel Blockers Failed - 06/14/2018  6:46 PM      Failed - Last BP in normal range    BP Readings from Last 1 Encounters:  08/10/17 (!) 136/95         Failed - Valid encounter within last 6 months    Recent Outpatient Visits          10 months ago Acquired hypothyroidism   Primary Care at [facility], Manus Rudd, MD   11 months ago Acquired hypothyroidism   Primary Care at [facility], Manus Rudd, MD   1 year ago Vertigo   Primary Care at [facility], Manus Rudd, MD      Future Appointments            In 3 weeks Doristine Bosworth, MD Primary Care at [place], [facility]         . levothyroxine (SYNTHROID, LEVOTHROID) 112 MCG tablet 90 tablet 0     Endocrinology:  Hypothyroid Agents Failed - 06/14/2018  6:46 PM      Failed - TSH needs to be rechecked within 3 months after an abnormal result. Refill until TSH is due.      Passed - TSH in normal range and within 360 days    TSH  Date Value Ref Range Status  07/02/2017 2.240 0.450 - 4.500 uIU/mL Final         Passed - Valid encounter within last 12 months    Recent Outpatient Visits          10 months ago Acquired hypothyroidism   Primary Care at [facility], Manus Rudd, MD   11 months ago Acquired hypothyroidism   Primary Care at [facility], Manus Rudd, MD   1 year ago Vertigo   Primary Care at [facility], Manus Rudd, MD      Future Appointments            In 3 weeks Doristine Bosworth, MD Primary Care at [place], [facility]

## 2018-07-08 ENCOUNTER — Encounter: Payer: Self-pay | Admitting: Family Medicine

## 2018-07-08 ENCOUNTER — Ambulatory Visit (INDEPENDENT_AMBULATORY_CARE_PROVIDER_SITE_OTHER): Payer: Medicaid Other | Admitting: Family Medicine

## 2018-07-08 ENCOUNTER — Other Ambulatory Visit: Payer: Self-pay

## 2018-07-08 DIAGNOSIS — E039 Hypothyroidism, unspecified: Secondary | ICD-10-CM

## 2018-07-08 DIAGNOSIS — Z1159 Encounter for screening for other viral diseases: Secondary | ICD-10-CM

## 2018-07-08 DIAGNOSIS — Z114 Encounter for screening for human immunodeficiency virus [HIV]: Secondary | ICD-10-CM

## 2018-07-08 DIAGNOSIS — Z131 Encounter for screening for diabetes mellitus: Secondary | ICD-10-CM

## 2018-07-08 DIAGNOSIS — Z13220 Encounter for screening for lipoid disorders: Secondary | ICD-10-CM

## 2018-07-08 DIAGNOSIS — Z1211 Encounter for screening for malignant neoplasm of colon: Secondary | ICD-10-CM

## 2018-07-08 DIAGNOSIS — E6609 Other obesity due to excess calories: Secondary | ICD-10-CM

## 2018-07-08 DIAGNOSIS — E291 Testicular hypofunction: Secondary | ICD-10-CM

## 2018-07-08 DIAGNOSIS — I10 Essential (primary) hypertension: Secondary | ICD-10-CM

## 2018-07-08 DIAGNOSIS — Z6831 Body mass index (BMI) 31.0-31.9, adult: Secondary | ICD-10-CM

## 2018-07-08 NOTE — Progress Notes (Signed)
Telemedicine Encounter- SOAP NOTE Established Patient  This telephone encounter was conducted with the patient's (or proxy's) verbal consent via audio telecommunications: yes/no: Yes Patient was instructed to have this encounter in a suitably private space; and to only have persons present to whom they give permission to participate. In addition, patient identity was confirmed by use of name plus two identifiers (DOB and address).  I discussed the limitations, risks, security and privacy concerns of performing an evaluation and management service by telephone and the availability of in person appointments. I also discussed with the patient that there may be a patient responsible charge related to this service. The patient expressed understanding and agreed to proceed.  I spent a total of TIME; 0 MIN TO 60 MIN: 15 minutes talking with the patient or their proxy.  Chief Complaint  Patient presents with  . Medication Management    Subjective   Phillip Cook is a 63 y.o. established patient. Telephone visit today for  HPI  HPI Phillip Cook presents for   Hypogonadism Lab Results  Component Value Date   TESTOSTERONE 107.5 (L) 08/10/2017   He reports that he has a history of low testosterone which is affecting his energy, his focus, his muscles and his general get up and go. He has low sex drive as well. He has no insurance and so could not afford androgel. He is waiting for medicare to kick in.   He also has acquired hypothyroidism Lab Results  Component Value Date   TSH 2.240 07/02/2017   He is compliant with his thyroid meds. He has low sex drive, low energy and fatigue.    Hypertension He takes his amlodipine as instructed He denies chest pains and palpitations He avoids salty foods No exercise No history of CAD   Colon Cancer Screening He had a colonoscopy about 10 years or more ago He denies blood in his stool, unexpected weight loss or pain with defecation No  rectal itching He does not smoke He does not have a family history of colon cancer     Past Medical History:  Diagnosis Date  . Avascular necrosis of bone of left hip (HCC) 10/31/2014  . Constipation due to pain medication   . Enlarged prostate   . Hypertension   . Hypothyroidism   . Primary localized osteoarthritis of right hip 09/19/2014    Past Surgical History:  Procedure Laterality Date  . arthroscopic  knee Left   . COLONOSCOPY    . TONSILLECTOMY    . TOTAL HIP ARTHROPLASTY Right 09/19/2014  . TOTAL HIP ARTHROPLASTY Right 09/19/2014   Procedure: TOTAL HIP ARTHROPLASTY;  Surgeon: Teryl Lucy, MD;  Location: [facility] OR;  Service: Orthopedics;  Laterality: Right;  . TOTAL HIP ARTHROPLASTY Left 10/31/2014   Procedure: TOTAL HIP ARTHROPLASTY;  Surgeon: Teryl Lucy, MD;  Location: [facility] OR;  Service: Orthopedics;  Laterality: Left;    No family history on file.  Social History   Socioeconomic History  . Marital status: Legally Separated    Spouse name: Not on file  . Number of children: Not on file  . Years of education: Not on file  . Highest education level: Not on file  Occupational History  . Not on file  Social Needs  . Financial resource strain: Not on file  . Food insecurity:    Worry: Not on file    Inability: Not on file  . Transportation needs:    Medical: Not on file    Non-medical:  Not on file  Tobacco Use  . Smoking status: Never Smoker  . Smokeless tobacco: Never Used  Substance and Sexual Activity  . Alcohol use: Yes    Alcohol/week: 2.0 standard drinks    Types: 1 Glasses of wine, 1 Cans of beer per week  . Drug use: Yes    Types: Marijuana    Comment: many years ago  . Sexual activity: Not on file  Lifestyle  . Physical activity:    Days per week: Not on file    Minutes per session: Not on file  . Stress: Not on file  Relationships  . Social connections:    Talks on phone: Not on file    Gets together: Not on file    Attends religious  service: Not on file    Active member of club or organization: Not on file    Attends meetings of clubs or organizations: Not on file    Relationship status: Not on file  . Intimate partner violence:    Fear of current or ex partner: Not on file    Emotionally abused: Not on file    Physically abused: Not on file    Forced sexual activity: Not on file  Other Topics Concern  . Not on file  Social History Narrative  . Not on file    Outpatient Medications Prior to Visit  Medication Sig Dispense Refill  . amLODipine (NORVASC) 5 MG tablet TAKE 1 TABLET BY MOUTH ONCE DAILY 60 tablet 1  . levothyroxine (SYNTHROID, LEVOTHROID) 112 MCG tablet Medication to cover pt until 07/08/2018 appointment; office visit needed for refills 30 tablet 0  . amLODipine (NORVASC) 5 MG tablet Take 1 tablet (5 mg total) by mouth daily. 30 tablet 0  . OVER THE COUNTER MEDICATION Take 1 tablet by mouth as needed (constipation). OTC laxative     No facility-administered medications prior to visit.     Allergies  Allergen Reactions  . Codeine Itching  . Other Other (See Comments)    Metal Nickel   Skin becomes red with itching and weeping      ROS Review of Systems Review of Systems  Constitutional: Negative for activity change, appetite change, chills and fever.  HENT: Negative for congestion, nosebleeds, trouble swallowing and voice change.   Respiratory: Negative for cough, shortness of breath and wheezing.   Gastrointestinal: Negative for diarrhea, nausea and vomiting.  Genitourinary: Negative for difficulty urinating, dysuria, flank pain and hematuria.  Musculoskeletal: Negative for back pain, joint swelling and neck pain.  Neurological: Negative for dizziness, speech difficulty, light-headedness and numbness.  See HPI. All other review of systems negative.     Objective:    Physical Exam  There were no vitals taken for this visit. Wt Readings from Last 3 Encounters:  08/10/17 245 lb 9.6 oz  (111.4 kg)  05/07/17 244 lb 6.4 oz (110.9 kg)  10/31/14 181 lb 14.4 oz (82.5 kg)     Health Maintenance Due  Topic Date Due  . Hepatitis C Screening  04/16/1955  . HIV Screening  06/30/1969  . TETANUS/TDAP  06/30/1973  . COLONOSCOPY  09/30/2015    There are no preventive care reminders to display for this patient.  Lab Results  Component Value Date   TSH 2.240 07/02/2017   Lab Results  Component Value Date   WBC 5.3 05/07/2017   HGB 15.9 05/07/2017   HCT 46.9 05/07/2017   MCV 89.5 05/07/2017   PLT 270 11/03/2014   Lab Results  Component Value Date   NA 143 05/07/2017   K 4.7 05/07/2017   CO2 19 (L) 05/07/2017   GLUCOSE 111 (H) 05/07/2017   BUN 18 05/07/2017   CREATININE 1.02 05/07/2017   BILITOT 0.3 05/07/2017   ALKPHOS 79 05/07/2017   AST 27 05/07/2017   ALT 31 05/07/2017   PROT 7.2 05/07/2017   ALBUMIN 4.7 05/07/2017   CALCIUM 10.0 05/07/2017   ANIONGAP 5 11/02/2014   Lab Results  Component Value Date   CHOL 231 (H) 08/10/2017   Lab Results  Component Value Date   HDL 40 08/10/2017   Lab Results  Component Value Date   LDLCALC 157 (H) 08/10/2017   Lab Results  Component Value Date   TRIG 172 (H) 08/10/2017   Lab Results  Component Value Date   CHOLHDL 5.8 (H) 08/10/2017   Lab Results  Component Value Date   HGBA1C 5.9 (H) 08/10/2017      Assessment & Plan:   Problem List Items Addressed This Visit      Cardiovascular and Mediastinum   Hypertension  - Patient's blood pressure is at goal of 139/89 or less. Condition is stable. Continue current medications and treatment plan. I recommend that you exercise for 30-45 minutes 5 days a week. I also recommend a balanced diet with fruits and vegetables every day, lean meats, and little fried foods. The DASH diet (you can find this online) is a good example of this.    Relevant Orders   Lipid panel   Comprehensive metabolic panel    Other Visit Diagnoses    Class 1 obesity due to excess  calories with serious comorbidity and body mass index (BMI) of 31.0 to 31.9 in adult    -  Primary   Relevant Orders   TSH   Screening for diabetes mellitus       Relevant Orders   Comprehensive metabolic panel   Screening, lipid       Relevant Orders   Lipid panel   Screening for colon cancer    -  Discussed cologuard Pt agreeable but will wait for medicare to kick in    Screening for HIV (human immunodeficiency virus)       Relevant Orders   HIV antibody (with reflex)   Need for hepatitis C screening test       Relevant Orders   HCV Phillip w Reflex to Quant PCR   Hypogonadism in male    - discussed androgel and advised goodrx for pricing Will supplement after review of liver function with androgel   Relevant Orders   TSH   Testosterone   Acquired hypothyroidism    -  Discussed that he had a stable thyroid Will check levels   Relevant Orders   TSH   Testosterone      No orders of the defined types were placed in this encounter.   Follow-up: Return in about 6 months (around 01/07/2019) for testosterone check .      I discussed the assessment and treatment plan with the patient. The patient was provided an opportunity to ask questions and all were answered. The patient agreed with the plan and demonstrated an understanding of the instructions.   The patient was advised to call back or seek an in-person evaluation if the symptoms worsen or if the condition fails to improve as anticipated.  I provided 15 minutes of non-face-to-face time during this encounter.  Doristine Bosworth, MD  Primary Care at [facility]

## 2018-07-09 ENCOUNTER — Ambulatory Visit (INDEPENDENT_AMBULATORY_CARE_PROVIDER_SITE_OTHER): Payer: Self-pay | Admitting: Family Medicine

## 2018-07-09 ENCOUNTER — Other Ambulatory Visit: Payer: Self-pay

## 2018-07-09 DIAGNOSIS — Z131 Encounter for screening for diabetes mellitus: Secondary | ICD-10-CM

## 2018-07-09 DIAGNOSIS — Z13220 Encounter for screening for lipoid disorders: Secondary | ICD-10-CM

## 2018-07-09 DIAGNOSIS — E6609 Other obesity due to excess calories: Secondary | ICD-10-CM

## 2018-07-09 DIAGNOSIS — Z6831 Body mass index (BMI) 31.0-31.9, adult: Secondary | ICD-10-CM

## 2018-07-09 DIAGNOSIS — I10 Essential (primary) hypertension: Secondary | ICD-10-CM

## 2018-07-09 DIAGNOSIS — E039 Hypothyroidism, unspecified: Secondary | ICD-10-CM

## 2018-07-09 DIAGNOSIS — Z114 Encounter for screening for human immunodeficiency virus [HIV]: Secondary | ICD-10-CM

## 2018-07-09 DIAGNOSIS — E291 Testicular hypofunction: Secondary | ICD-10-CM

## 2018-07-09 DIAGNOSIS — Z1159 Encounter for screening for other viral diseases: Secondary | ICD-10-CM

## 2018-07-09 NOTE — Progress Notes (Signed)
Nurse visit for lab only 

## 2018-07-11 MED ORDER — LEVOTHYROXINE SODIUM 112 MCG PO TABS: ORAL_TABLET | ORAL | 1 refills | Status: DC

## 2018-07-11 MED ORDER — ATORVASTATIN CALCIUM 20 MG PO TABS: 20.0000 mg | ORAL_TABLET | Freq: Every day | ORAL | 3 refills | Status: AC

## 2018-07-12 ENCOUNTER — Other Ambulatory Visit: Payer: Self-pay

## 2018-07-12 ENCOUNTER — Telehealth: Payer: Self-pay | Admitting: Family Medicine

## 2018-07-12 DIAGNOSIS — Z79899 Other long term (current) drug therapy: Secondary | ICD-10-CM

## 2018-07-12 DIAGNOSIS — I10 Essential (primary) hypertension: Secondary | ICD-10-CM

## 2018-07-12 LAB — LIPID PANEL
Chol/HDL Ratio: 5.6 ratio — ABNORMAL HIGH (ref 0.0–5.0)
Cholesterol, Total: 230 mg/dL — ABNORMAL HIGH (ref 100–199)
HDL: 41 mg/dL (ref >39)
LDL Calculated: 155 mg/dL — ABNORMAL HIGH (ref 0–99)
Triglycerides: 169 mg/dL — ABNORMAL HIGH (ref 0–149)
VLDL Cholesterol Cal: 34 mg/dL (ref 5–40)

## 2018-07-12 LAB — COMPREHENSIVE METABOLIC PANEL
ALT: 56 IU/L — ABNORMAL HIGH (ref 0–44)
AST: 51 IU/L — ABNORMAL HIGH (ref 0–40)
Albumin/Globulin Ratio: 1.6 (ref 1.2–2.2)
Albumin: 4.3 g/dL (ref 3.8–4.8)
Alkaline Phosphatase: 76 IU/L (ref 39–117)
BUN/Creatinine Ratio: 16 (ref 10–24)
BUN: 19 mg/dL (ref 8–27)
Bilirubin Total: 0.3 mg/dL (ref 0.0–1.2)
CO2: 16 mmol/L — ABNORMAL LOW (ref 20–29)
Calcium: 10.2 mg/dL (ref 8.6–10.2)
Chloride: 108 mmol/L — ABNORMAL HIGH (ref 96–106)
Creatinine, Ser: 1.16 mg/dL (ref 0.76–1.27)
GFR calc Af Amer: 77 mL/min/{1.73_m2} (ref >59)
GFR calc non Af Amer: 66 mL/min/{1.73_m2} (ref >59)
Globulin, Total: 2.7 g/dL (ref 1.5–4.5)
Glucose: 120 mg/dL — ABNORMAL HIGH (ref 65–99)
Potassium: 4.7 mmol/L (ref 3.5–5.2)
Sodium: 144 mmol/L (ref 134–144)
Total Protein: 7 g/dL (ref 6.0–8.5)

## 2018-07-12 LAB — TSH: TSH: 0.609 u[IU]/mL (ref 0.450–4.500)

## 2018-07-12 LAB — TESTOSTERONE: Testosterone: 66 ng/dL — ABNORMAL LOW (ref 264–916)

## 2018-07-12 LAB — HCV INTERPRETATION

## 2018-07-12 LAB — HIV ANTIBODY (ROUTINE TESTING W REFLEX): HIV Screen 4th Generation wRfx: NONREACTIVE

## 2018-07-12 LAB — HCV AB W REFLEX TO QUANT PCR: HCV Ab: <0.1 s/co ratio (ref 0.0–0.9)

## 2018-07-12 MED ORDER — AMLODIPINE BESYLATE 5 MG PO TABS: 5.0000 mg | ORAL_TABLET | Freq: Every day | ORAL | 0 refills | Status: DC

## 2018-07-12 NOTE — Telephone Encounter (Signed)
-----   Message from [place], [place] sent at 07/12/2018 10:14 AM EDT ----- Would like to discuss testosterone meds wasn't sure what you meant by he needs to discuss her supplementation.

## 2018-07-12 NOTE — Telephone Encounter (Signed)
Discussed his results Discussed that he should contact [facility] Gave phone number Reviewed his refills

## 2018-07-20 ENCOUNTER — Telehealth (INDEPENDENT_AMBULATORY_CARE_PROVIDER_SITE_OTHER): Payer: Medicaid Other | Admitting: Family Medicine

## 2018-07-20 ENCOUNTER — Encounter: Payer: Self-pay | Admitting: Family Medicine

## 2018-07-20 ENCOUNTER — Other Ambulatory Visit: Payer: Self-pay

## 2018-07-20 DIAGNOSIS — E785 Hyperlipidemia, unspecified: Secondary | ICD-10-CM

## 2018-07-20 DIAGNOSIS — I10 Essential (primary) hypertension: Secondary | ICD-10-CM

## 2018-07-20 DIAGNOSIS — E291 Testicular hypofunction: Secondary | ICD-10-CM

## 2018-07-20 NOTE — Progress Notes (Signed)
Pt is needing refills on norvasc and levothyroxine, asking for 90 say supplies. He has not picked up the Lipitor medication. He says this is due to hearing bad reviews on taking statin drugs, wanting to know is there an alternative to this med. Wants to get bk on his t gel. Has been taking it for yrs, says he will sign release for the records of his past urologist. He does have appt tomorrow with urology. He's asking to get started on a low dose if possible. Says his quality of life will be much better if this can happen. He has not traveled and does not have any anxiety or depression. His frustration is not being able to use the t gel as before.

## 2018-07-20 NOTE — Progress Notes (Signed)
Telemedicine Encounter- SOAP NOTE Established Patient  This telephone encounter was conducted with the patient's (or proxy's) verbal consent via audio telecommunications: yes/no: Yes Patient was instructed to have this encounter in a suitably private space; and to only have persons present to whom they give permission to participate. In addition, patient identity was confirmed by use of name plus two identifiers (DOB and address).  I discussed the limitations, risks, security and privacy concerns of performing an evaluation and management service by telephone and the availability of in person appointments. I also discussed with the patient that there may be a patient responsible charge related to this service. The patient expressed understanding and agreed to proceed.  I spent a total of TIME; 0 MIN TO 60 MIN: 20 minutes talking with the patient or their proxy.  No chief complaint on file.   Subjective   Phillip Cook is a 63 y.o. established patient. Telephone visit today for  HPI  Hypogonadism Patient reports that he was had testosterone gel  He states that due to cost he has not been able to afford the medication He states that he has no motivation to do anything He is dealing with low mood He does not have energy to exercise He feels like he has very  Little muscle mass Lab Results  Component Value Date   TESTOSTERONE 66 (L) 07/09/2018    [facility] 07/20/2018 08/10/2017 05/07/2017  Decreased Interest 0 0 0  Down, Depressed, Hopeless 0 0 0  PHQ - 2 Score 0 0 0    Dyslipidemia Patient has a history of high cholesterol, high triglycerides and high TC He was prescribed atorvastatin but did not take because of side effect of muscle pain He was concerned because his muscles are already so weak He denies any history of issues with statins and has no muscle pain now No chest pains, palpitaions, SOB Lab Results  Component Value Date   CHOL 230 (H) 07/09/2018   CHOL 231 (H) 08/10/2017   Lab Results  Component Value Date   HDL 41 07/09/2018   HDL 40 08/10/2017   Lab Results  Component Value Date   LDLCALC 155 (H) 07/09/2018   LDLCALC 157 (H) 08/10/2017   Lab Results  Component Value Date   TRIG 169 (H) 07/09/2018   TRIG 172 (H) 08/10/2017   Lab Results  Component Value Date   CHOLHDL 5.6 (H) 07/09/2018   CHOLHDL 5.8 (H) 08/10/2017   No results found for: LDLDIRECT  The 10-year ASCVD risk score Denman George(Goff DC Jr., et al., 2013) is: 17%   Values used to calculate the score:     Age: [age] years     Sex: Male     Is Non-Hispanic African American: No     Diabetic: No     Tobacco smoker: No     Systolic Blood Pressure: 128 mmHg     Is BP treated: Yes     HDL Cholesterol: 41 mg/dL     Total Cholesterol: 230 mg/dL  Hypertension  Pt reports that he has been compliant with his amlodipine 5mg  He does not have a cuff His bp were always pretty good BP Readings from Last 3 Encounters:  08/10/17 (!) 136/95  05/07/17 (!) 143/96  11/03/14 107/67      Patient Active Problem List   Diagnosis Date Noted  . Avascular necrosis of bone of left hip (HCC) 10/31/2014  . Enlarged prostate   . Hypertension   . Primary  localized osteoarthritis of right hip 09/19/2014  . Hip arthritis 09/19/2014  . History of ETOH abuse     Past Medical History:  Diagnosis Date  . Avascular necrosis of bone of left hip (HCC) 10/31/2014  . Constipation due to pain medication   . Enlarged prostate   . Hypertension   . Hypothyroidism   . Primary localized osteoarthritis of right hip 09/19/2014    Current Outpatient Medications  Medication Sig Dispense Refill  . amLODipine (NORVASC) 5 MG tablet TAKE 1 TABLET BY MOUTH ONCE DAILY 60 tablet 1  . amLODipine (NORVASC) 5 MG tablet Take 1 tablet (5 mg total) by mouth daily. 90 tablet 0  . atorvastatin (LIPITOR) 20 MG tablet Take 1 tablet (20 mg total) by mouth daily. 90 tablet 3  . levothyroxine (SYNTHROID, LEVOTHROID)  112 MCG tablet Medication to cover pt until 07/08/2018 appointment; office visit needed for refills 90 tablet 1  . OVER THE COUNTER MEDICATION Take 1 tablet by mouth as needed (constipation). OTC laxative     No current facility-administered medications for this visit.     Allergies  Allergen Reactions  . Codeine Itching  . Other Other (See Comments)    Metal Nickel   Skin becomes red with itching and weeping      Social History   Socioeconomic History  . Marital status: Legally Separated    Spouse name: Not on file  . Number of children: Not on file  . Years of education: Not on file  . Highest education level: Not on file  Occupational History  . Not on file  Social Needs  . Financial resource strain: Not on file  . Food insecurity:    Worry: Not on file    Inability: Not on file  . Transportation needs:    Medical: Not on file    Non-medical: Not on file  Tobacco Use  . Smoking status: Never Smoker  . Smokeless tobacco: Never Used  Substance and Sexual Activity  . Alcohol use: Yes    Alcohol/week: 2.0 standard drinks    Types: 1 Glasses of wine, 1 Cans of beer per week  . Drug use: Yes    Types: Marijuana    Comment: many years ago  . Sexual activity: Not on file  Lifestyle  . Physical activity:    Days per week: Not on file    Minutes per session: Not on file  . Stress: Not on file  Relationships  . Social connections:    Talks on phone: Not on file    Gets together: Not on file    Attends religious service: Not on file    Active member of club or organization: Not on file    Attends meetings of clubs or organizations: Not on file    Relationship status: Not on file  . Intimate partner violence:    Fear of current or ex partner: Not on file    Emotionally abused: Not on file    Physically abused: Not on file    Forced sexual activity: Not on file  Other Topics Concern  . Not on file  Social History Narrative  . Not on file    ROS Review of Systems   Constitutional: Negative for activity change, appetite change, chills and fever.  HENT: Negative for congestion, nosebleeds, trouble swallowing and voice change.   Respiratory: Negative for cough, shortness of breath and wheezing.   Gastrointestinal: Negative for diarrhea, nausea and vomiting.  Genitourinary: Negative for difficulty  urinating, dysuria, flank pain and hematuria.  Musculoskeletal: Negative for back pain, joint swelling and neck pain.  Neurological: Negative for dizziness, speech difficulty, light-headedness and numbness.  See HPI. All other review of systems negative.   Objective   Vitals as reported by the patient: There were no vitals filed for this visit.  Diagnoses and all orders for this visit:  Essential hypertension-  Discussed amlodpidine DASH diet emphasized  Hypogonadism in male-  Discussed that he should establish with Urology He should get prostate cancer screening prior to started testosterone supplementation  Dyslipidemia- consistently elevated cholesterol and triglyceride Discussed statin side effects and adverse reactions Discussed lifestyle modifications     I discussed the assessment and treatment plan with the patient. The patient was provided an opportunity to ask questions and all were answered. The patient agreed with the plan and demonstrated an understanding of the instructions.   The patient was advised to call back or seek an in-person evaluation if the symptoms worsen or if the condition fails to improve as anticipated.  I provided 20 minutes of non-face-to-face time during this encounter.  Doristine Bosworth, MD  Primary Care at [facility]

## 2018-08-12 ENCOUNTER — Telehealth: Payer: Self-pay | Admitting: Family Medicine

## 2018-08-12 ENCOUNTER — Other Ambulatory Visit: Payer: Self-pay | Admitting: Family Medicine

## 2018-08-12 DIAGNOSIS — E039 Hypothyroidism, unspecified: Secondary | ICD-10-CM

## 2018-08-12 NOTE — Telephone Encounter (Signed)
Copied from CRM [id number]. Topic: Quick Communication - See Telephone Encounter >> Aug 12, 2018 11:51 AM Aretta Nip wrote: CRM for notification. See Telephone encounter for: 08/12/18.amLODipine (NORVASC) 5 MG tablet Had requested 90 day refill (only 30)levothyroxine (SYNTHROID, LEVOTHROID) 112 MCG tablet  Medication   Date: 07/11/2018 Department: Primary Care at [place] Ordering/Authorizing: Doristine Bosworth, MD [facility] [address], [place] - [address] [phone] (Phone) [phone] (Fax)  All scripts to go to [facility] and the [facility] visit was documented to do all refills and to do all as 90 day supplys, pt has no insurance, pays out of pocket so does not matter

## 2018-08-12 NOTE — Telephone Encounter (Signed)
Requested Prescriptions  Pending Prescriptions Disp Refills  . levothyroxine (EUTHYROX) 112 MCG tablet [Pharmacy Med Name: Euthyrox 112 MCG Oral Tablet] 90 tablet 3    Sig: Take 1 tablet (112 mcg total) by mouth daily.     Endocrinology:  Hypothyroid Agents Failed - 08/12/2018 11:45 AM      Failed - TSH needs to be rechecked within 3 months after an abnormal result. Refill until TSH is due.      Passed - TSH in normal range and within 360 days    TSH  Date Value Ref Range Status  07/09/2018 0.609 0.450 - 4.500 uIU/mL Final         Passed - Valid encounter within last 12 months    Recent Outpatient Visits          1 month ago Hypogonadism in male   Primary Care at [facility], [place] A, MD   1 month ago Class 1 obesity due to excess calories with serious comorbidity and body mass index (BMI) of 31.0 to 31.9 in adult   Primary Care at [facility], [place] Rudd, MD   1 year ago Acquired hypothyroidism   Primary Care at [facility], Manus Rudd, MD   1 year ago Acquired hypothyroidism   Primary Care at [facility], Manus Rudd, MD   1 year ago Vertigo   Primary Care at [facility], Manus Rudd, MD

## 2018-08-12 NOTE — Telephone Encounter (Signed)
Requested Prescriptions  Pending Prescriptions Disp Refills  . EUTHYROX 112 MCG tablet [Pharmacy Med Name: Euthyrox 112 MCG Oral Tablet] 90 tablet 3    Sig: TAKE ONE TABLET BY MOUTH DAILY. OFFICE VISIT NEEDED FOR REFILLS     Endocrinology:  Hypothyroid Agents Failed - 08/12/2018 11:45 AM      Failed - TSH needs to be rechecked within 3 months after an abnormal result. Refill until TSH is due.      Passed - TSH in normal range and within 360 days    TSH  Date Value Ref Range Status  07/09/2018 0.609 0.450 - 4.500 uIU/mL Final         Passed - Valid encounter within last 12 months    Recent Outpatient Visits          1 month ago Hypogonadism in male   Primary Care at [facility], [place] A, MD   1 month ago Class 1 obesity due to excess calories with serious comorbidity and body mass index (BMI) of 31.0 to 31.9 in adult   Primary Care at [facility], Manus Rudd, MD   1 year ago Acquired hypothyroidism   Primary Care at [facility], Manus Rudd, MD   1 year ago Acquired hypothyroidism   Primary Care at [facility], Manus Rudd, MD   1 year ago Vertigo   Primary Care at [facility], Manus Rudd, MD

## 2018-08-18 NOTE — Telephone Encounter (Signed)
All others were sent as 90 days but levothyroxine is not on the list.

## 2019-01-20 ENCOUNTER — Other Ambulatory Visit: Payer: Self-pay | Admitting: Family Medicine

## 2019-01-20 DIAGNOSIS — I10 Essential (primary) hypertension: Secondary | ICD-10-CM

## 2019-01-20 DIAGNOSIS — Z79899 Other long term (current) drug therapy: Secondary | ICD-10-CM

## 2019-01-20 NOTE — Telephone Encounter (Signed)
Forwarding medication refill request to the clinical pool for review. 

## 2019-01-21 NOTE — Telephone Encounter (Signed)
Attempted to call pt to get pt scheduled for appt prior to med refill of Norvasc.   No answer so I left a message to call back  Plan: to schedule appt before sending in refill of med.

## 2019-01-24 NOTE — Telephone Encounter (Signed)
Spoke with pt and scheduled appt °

## 2019-01-31 ENCOUNTER — Encounter: Payer: Self-pay | Admitting: Family Medicine

## 2019-01-31 ENCOUNTER — Telehealth (INDEPENDENT_AMBULATORY_CARE_PROVIDER_SITE_OTHER): Payer: Self-pay | Admitting: Family Medicine

## 2019-01-31 ENCOUNTER — Other Ambulatory Visit: Payer: Self-pay

## 2019-01-31 VITALS — Ht 74.0 in | Wt 230.0 lb

## 2019-01-31 DIAGNOSIS — E039 Hypothyroidism, unspecified: Secondary | ICD-10-CM

## 2019-01-31 DIAGNOSIS — Z125 Encounter for screening for malignant neoplasm of prostate: Secondary | ICD-10-CM

## 2019-01-31 DIAGNOSIS — Z79899 Other long term (current) drug therapy: Secondary | ICD-10-CM

## 2019-01-31 DIAGNOSIS — I10 Essential (primary) hypertension: Secondary | ICD-10-CM

## 2019-01-31 MED ORDER — AMLODIPINE BESYLATE 5 MG PO TABS: 5.0000 mg | ORAL_TABLET | Freq: Every day | ORAL | 0 refills | Status: DC

## 2019-01-31 MED ORDER — LEVOTHYROXINE SODIUM 112 MCG PO TABS: ORAL_TABLET | ORAL | 1 refills | Status: AC

## 2019-01-31 NOTE — Progress Notes (Signed)
Telemedicine Encounter- SOAP NOTE Established Patient  This telephone encounter was conducted with the patient's (or proxy's) verbal consent via audio telecommunications: yes/no: Yes Patient was instructed to have this encounter in a suitably private space; and to only have persons present to whom they give permission to participate. In addition, patient identity was confirmed by use of name plus two identifiers (DOB and address).  I discussed the limitations, risks, security and privacy concerns of performing an evaluation and management service by telephone and the availability of in person appointments. I also discussed with the patient that there may be a patient responsible charge related to this service. The patient expressed understanding and agreed to proceed.  I spent a total of TIME; 0 MIN TO 60 MIN: 20 minutes talking with the patient or their proxy.  Chief Complaint  Patient presents with  . Medication Refill    Subjective   Phillip Cook is a 63 y.o. established patient. Telephone visit today for  HPI  Hypertension: Patient here for follow-up of elevated blood pressure. He is exercising and is adherent to low salt diet.  Blood pressure is well controlled at home. Cardiac symptoms none. Patient denies chest pain, claudication, exertional chest pressure/discomfort, irregular heart beat and lower extremity edema.  Cardiovascular risk factors: hypertension. Use of agents associated with hypertension: thyroid hormones.  BP Readings from Last 3 Encounters:  08/10/17 (!) 136/95  05/07/17 (!) 143/96  11/03/14 107/67   Hypothyroidism: Patient presents for evaluation of thyroid function. Symptoms consist of denies fatigue, weight changes, heat/cold intolerance, bowel/skin changes or CVS symptoms. He has issues with erections.   Lab Results  Component Value Date   TSH 0.609 07/09/2018   Prostate Cancer Screening He has issues with erections. He was on testosterone previously.   No family history of prostate cancer He has a history of enlarged prostate He went to the Urologist but his out of pocket would be $200 and he cannot afford it He is not able to have sex due to ED   Patient Active Problem List   Diagnosis Date Noted  . Avascular necrosis of bone of left hip ([place]) 10/31/2014  . Enlarged prostate   . Hypertension   . Primary localized osteoarthritis of right hip 09/19/2014  . Hip arthritis 09/19/2014  . History of ETOH abuse     Past Medical History:  Diagnosis Date  . Avascular necrosis of bone of left hip ([place]) 10/31/2014  . Constipation due to pain medication   . Enlarged prostate   . Hypertension   . Hypothyroidism   . Primary localized osteoarthritis of right hip 09/19/2014    Current Outpatient Medications  Medication Sig Dispense Refill  . amLODipine (NORVASC) 5 MG tablet Take 1 tablet (5 mg total) by mouth daily. 90 tablet 0  . levothyroxine (SYNTHROID) 112 MCG tablet TAKE 1 TABLET BY MOUTH ONE HOUR BEFORE BREAKFAST. DRINK PLENTY OF FLUIDS TO WASH PILL DOWN 90 tablet 1  . atorvastatin (LIPITOR) 20 MG tablet Take 1 tablet (20 mg total) by mouth daily. (Patient not taking: Reported on 01/31/2019) 90 tablet 3   No current facility-administered medications for this visit.     Allergies  Allergen Reactions  . Codeine Itching  . Other Other (See Comments)    Metal Nickel   Skin becomes red with itching and weeping      Social History   Socioeconomic History  . Marital status: Legally Separated    Spouse name: Not on file  .  Number of children: Not on file  . Years of education: Not on file  . Highest education level: Not on file  Occupational History  . Not on file  Social Needs  . Financial resource strain: Not on file  . Food insecurity    Worry: Not on file    Inability: Not on file  . Transportation needs    Medical: Not on file    Non-medical: Not on file  Tobacco Use  . Smoking status: Never Smoker  . Smokeless  tobacco: Never Used  Substance and Sexual Activity  . Alcohol use: Yes    Alcohol/week: 2.0 standard drinks    Types: 1 Glasses of wine, 1 Cans of beer per week  . Drug use: Not Currently    Types: Marijuana    Comment: many years ago  . Sexual activity: Not on file  Lifestyle  . Physical activity    Days per week: Not on file    Minutes per session: Not on file  . Stress: Not on file  Relationships  . Social Herbalist on phone: Not on file    Gets together: Not on file    Attends religious service: Not on file    Active member of club or organization: Not on file    Attends meetings of clubs or organizations: Not on file    Relationship status: Not on file  . Intimate partner violence    Fear of current or ex partner: Not on file    Emotionally abused: Not on file    Physically abused: Not on file    Forced sexual activity: Not on file  Other Topics Concern  . Not on file  Social History Narrative  . Not on file    ROS Review of Systems  Constitutional: Negative for activity change, appetite change, chills and fever.  HENT: Negative for congestion, nosebleeds, trouble swallowing and voice change.   Respiratory: Negative for cough, shortness of breath and wheezing.   Gastrointestinal: Negative for diarrhea, nausea and vomiting.  Genitourinary: Negative for difficulty urinating, dysuria, flank pain and hematuria.  Musculoskeletal: Negative for back pain, joint swelling and neck pain.  Neurological: Negative for dizziness, speech difficulty, light-headedness and numbness.  See HPI. All other review of systems negative.   Objective   Vitals as reported by the patient: Today's Vitals   01/31/19 1004  Weight: 230 lb (104.3 kg)  Height: 6' 2"  (1.88 m)    [name] was seen today for medication refill.  Diagnoses and all orders for this visit:  Acquired hypothyroidism- Thyroid stable Will monitor tsh Continue current dose of levothyroxine Discussed that any  changes would require a earlier check Discussed that thyroid medication should always be taken on an empty stomach  -     levothyroxine (SYNTHROID) 112 MCG tablet; TAKE 1 TABLET BY MOUTH ONE HOUR BEFORE BREAKFAST. DRINK PLENTY OF FLUIDS TO [place] PILL DOWN -     TSH; Future -     CMP14+EGFR; Future -     Lipid panel; Future  Essential hypertension- Patient's blood pressure is at goal of 139/89 or less. Condition is stable. Continue current medications and treatment plan. I recommend that you exercise for 30-45 minutes 5 days a week. I also recommend a balanced diet with fruits and vegetables every day, lean meats, and little fried foods. The DASH diet (you can find this online) is a good example of this.  -     amLODipine (NORVASC) 5  MG tablet; Take 1 tablet (5 mg total) by mouth daily.  Encounter for medication management -     amLODipine (NORVASC) 5 MG tablet; Take 1 tablet (5 mg total) by mouth daily. -     TSH; Future -     CMP14+EGFR; Future -     Lipid panel; Future  Screening for malignant neoplasm of prostate- discussed psa for prostate screening Advised pt to see Urology for hypogonadism Due to cost he was not able to afford the copay -     PSA; Future     I discussed the assessment and treatment plan with the patient. The patient was provided an opportunity to ask questions and all were answered. The patient agreed with the plan and demonstrated an understanding of the instructions.   The patient was advised to call back or seek an in-person evaluation if the symptoms worsen or if the condition fails to improve as anticipated.  I provided 20  minutes of non-face-to-face time during this encounter.  Forrest Moron, MD  Primary Care at [facility]

## 2019-05-09 ENCOUNTER — Other Ambulatory Visit: Payer: Self-pay | Admitting: Family Medicine

## 2019-05-09 DIAGNOSIS — Z79899 Other long term (current) drug therapy: Secondary | ICD-10-CM

## 2019-05-09 DIAGNOSIS — I10 Essential (primary) hypertension: Secondary | ICD-10-CM

## 2019-05-09 NOTE — Telephone Encounter (Signed)
Requested Prescriptions  Pending Prescriptions Disp Refills  . amLODipine (NORVASC) 5 MG tablet [Pharmacy Med Name: AMLODIPINE BESYLATE 5 MG TAB] 90 tablet 0    Sig: TAKE 1 TABLET BY MOUTH EVERY DAY     Cardiovascular:  Calcium Channel Blockers Failed - 05/09/2019  1:34 PM      Failed - Last BP in normal range    BP Readings from Last 1 Encounters:  08/10/17 (!) 136/95         Passed - Valid encounter within last 6 months    Recent Outpatient Visits          3 months ago Acquired hypothyroidism   Primary Care at [facility], [place] A, MD   9 months ago Essential hypertension   Primary Care at [facility], [place] A, MD   10 months ago Hypogonadism in male   Primary Care at [facility], [place] A, MD   10 months ago Class 1 obesity due to excess calories with serious comorbidity and body mass index (BMI) of 31.0 to 31.9 in adult   Primary Care at [facility], [place] Rudd, MD   1 year ago Acquired hypothyroidism   Primary Care at [facility], [place] Rudd, MD
